# Patient Record
Sex: Female | Born: 1955 | Race: White | Hispanic: No | Marital: Married | Smoking: Former smoker
Health system: Southern US, Community
[De-identification: ages and names within clinical notes are randomized; demographics above are authoritative.]

## PROBLEM LIST (undated history)

## (undated) DIAGNOSIS — E119 Type 2 diabetes mellitus without complications: Secondary | ICD-10-CM

## (undated) DIAGNOSIS — R7303 Prediabetes: Secondary | ICD-10-CM

## (undated) DIAGNOSIS — I10 Essential (primary) hypertension: Secondary | ICD-10-CM

## (undated) DIAGNOSIS — C541 Malignant neoplasm of endometrium: Secondary | ICD-10-CM

## (undated) DIAGNOSIS — J45909 Unspecified asthma, uncomplicated: Secondary | ICD-10-CM

## (undated) DIAGNOSIS — G8929 Other chronic pain: Secondary | ICD-10-CM

---

## 2003-05-12 ENCOUNTER — Other Ambulatory Visit: Payer: Self-pay

## 2013-03-27 ENCOUNTER — Encounter (HOSPITAL_COMMUNITY): Admission: EM | Disposition: E | Payer: Self-pay | Source: Home / Self Care | Attending: Internal Medicine

## 2013-03-27 ENCOUNTER — Inpatient Hospital Stay (HOSPITAL_COMMUNITY): Payer: 59

## 2013-03-27 ENCOUNTER — Inpatient Hospital Stay (HOSPITAL_COMMUNITY)
Admission: EM | Admit: 2013-03-27 | Discharge: 2013-04-13 | DRG: 207 | Disposition: E | Payer: 59 | Attending: Internal Medicine | Admitting: Internal Medicine

## 2013-03-27 ENCOUNTER — Encounter (HOSPITAL_COMMUNITY): Payer: Self-pay | Admitting: Emergency Medicine

## 2013-03-27 DIAGNOSIS — C549 Malignant neoplasm of corpus uteri, unspecified: Secondary | ICD-10-CM | POA: Diagnosis present

## 2013-03-27 DIAGNOSIS — R000 Tachycardia, unspecified: Secondary | ICD-10-CM | POA: Diagnosis present

## 2013-03-27 DIAGNOSIS — C541 Malignant neoplasm of endometrium: Secondary | ICD-10-CM

## 2013-03-27 DIAGNOSIS — R319 Hematuria, unspecified: Secondary | ICD-10-CM | POA: Diagnosis present

## 2013-03-27 DIAGNOSIS — R079 Chest pain, unspecified: Secondary | ICD-10-CM

## 2013-03-27 DIAGNOSIS — J4489 Other specified chronic obstructive pulmonary disease: Secondary | ICD-10-CM | POA: Diagnosis present

## 2013-03-27 DIAGNOSIS — J9601 Acute respiratory failure with hypoxia: Secondary | ICD-10-CM | POA: Diagnosis present

## 2013-03-27 DIAGNOSIS — R102 Pelvic and perineal pain: Secondary | ICD-10-CM | POA: Diagnosis present

## 2013-03-27 DIAGNOSIS — E119 Type 2 diabetes mellitus without complications: Secondary | ICD-10-CM | POA: Diagnosis present

## 2013-03-27 DIAGNOSIS — I447 Left bundle-branch block, unspecified: Secondary | ICD-10-CM | POA: Diagnosis present

## 2013-03-27 DIAGNOSIS — J449 Chronic obstructive pulmonary disease, unspecified: Secondary | ICD-10-CM | POA: Diagnosis present

## 2013-03-27 DIAGNOSIS — Z8249 Family history of ischemic heart disease and other diseases of the circulatory system: Secondary | ICD-10-CM

## 2013-03-27 DIAGNOSIS — C7800 Secondary malignant neoplasm of unspecified lung: Principal | ICD-10-CM | POA: Diagnosis present

## 2013-03-27 DIAGNOSIS — R0902 Hypoxemia: Secondary | ICD-10-CM

## 2013-03-27 DIAGNOSIS — J9600 Acute respiratory failure, unspecified whether with hypoxia or hypercapnia: Secondary | ICD-10-CM | POA: Diagnosis present

## 2013-03-27 DIAGNOSIS — Z66 Do not resuscitate: Secondary | ICD-10-CM | POA: Diagnosis not present

## 2013-03-27 DIAGNOSIS — Z515 Encounter for palliative care: Secondary | ICD-10-CM

## 2013-03-27 DIAGNOSIS — E871 Hypo-osmolality and hyponatremia: Secondary | ICD-10-CM | POA: Diagnosis present

## 2013-03-27 DIAGNOSIS — I10 Essential (primary) hypertension: Secondary | ICD-10-CM | POA: Diagnosis present

## 2013-03-27 DIAGNOSIS — N939 Abnormal uterine and vaginal bleeding, unspecified: Secondary | ICD-10-CM | POA: Diagnosis present

## 2013-03-27 DIAGNOSIS — N898 Other specified noninflammatory disorders of vagina: Secondary | ICD-10-CM | POA: Diagnosis present

## 2013-03-27 DIAGNOSIS — N179 Acute kidney failure, unspecified: Secondary | ICD-10-CM | POA: Diagnosis present

## 2013-03-27 DIAGNOSIS — N950 Postmenopausal bleeding: Secondary | ICD-10-CM

## 2013-03-27 DIAGNOSIS — E669 Obesity, unspecified: Secondary | ICD-10-CM | POA: Diagnosis present

## 2013-03-27 DIAGNOSIS — R9431 Abnormal electrocardiogram [ECG] [EKG]: Secondary | ICD-10-CM

## 2013-03-27 DIAGNOSIS — D72829 Elevated white blood cell count, unspecified: Secondary | ICD-10-CM | POA: Diagnosis present

## 2013-03-27 DIAGNOSIS — R918 Other nonspecific abnormal finding of lung field: Secondary | ICD-10-CM | POA: Diagnosis present

## 2013-03-27 DIAGNOSIS — D45 Polycythemia vera: Secondary | ICD-10-CM | POA: Diagnosis present

## 2013-03-27 DIAGNOSIS — E8779 Other fluid overload: Secondary | ICD-10-CM | POA: Diagnosis present

## 2013-03-27 DIAGNOSIS — IMO0002 Reserved for concepts with insufficient information to code with codable children: Secondary | ICD-10-CM

## 2013-03-27 DIAGNOSIS — K5900 Constipation, unspecified: Secondary | ICD-10-CM | POA: Diagnosis present

## 2013-03-27 DIAGNOSIS — M549 Dorsalgia, unspecified: Secondary | ICD-10-CM | POA: Diagnosis present

## 2013-03-27 DIAGNOSIS — F17200 Nicotine dependence, unspecified, uncomplicated: Secondary | ICD-10-CM | POA: Diagnosis present

## 2013-03-27 DIAGNOSIS — Z6841 Body Mass Index (BMI) 40.0 and over, adult: Secondary | ICD-10-CM

## 2013-03-27 DIAGNOSIS — G8929 Other chronic pain: Secondary | ICD-10-CM | POA: Diagnosis present

## 2013-03-27 HISTORY — DX: Prediabetes: R73.03

## 2013-03-27 HISTORY — DX: Unspecified asthma, uncomplicated: J45.909

## 2013-03-27 HISTORY — DX: Essential (primary) hypertension: I10

## 2013-03-27 HISTORY — PX: LEFT HEART CATHETERIZATION WITH CORONARY ANGIOGRAM: SHX5451

## 2013-03-27 HISTORY — DX: Other chronic pain: G89.29

## 2013-03-27 HISTORY — DX: Type 2 diabetes mellitus without complications: E11.9

## 2013-03-27 HISTORY — DX: Malignant neoplasm of endometrium: C54.1

## 2013-03-27 LAB — ANGIOTENSIN CONVERTING ENZYME: Angiotensin-Converting Enzyme: 28 U/L (ref 8–52)

## 2013-03-27 LAB — COMPREHENSIVE METABOLIC PANEL
ALT: 13 U/L (ref 0–35)
AST: 30 U/L (ref 0–37)
Albumin: 3.1 g/dL — ABNORMAL LOW (ref 3.5–5.2)
Alkaline Phosphatase: 106 U/L (ref 39–117)
BUN: 20 mg/dL (ref 6–23)
CO2: 24 meq/L (ref 19–32)
CREATININE: 0.85 mg/dL (ref 0.50–1.10)
Calcium: 10.1 mg/dL (ref 8.4–10.5)
Chloride: 91 mEq/L — ABNORMAL LOW (ref 96–112)
GFR calc Af Amer: 86 mL/min — ABNORMAL LOW (ref >90)
GFR, EST NON AFRICAN AMERICAN: 74 mL/min — AB (ref >90)
Glucose, Bld: 105 mg/dL — ABNORMAL HIGH (ref 70–99)
Potassium: 4.8 mEq/L (ref 3.7–5.3)
SODIUM: 132 meq/L — AB (ref 137–147)
Total Bilirubin: 1 mg/dL (ref 0.3–1.2)
Total Protein: 7.3 g/dL (ref 6.0–8.3)

## 2013-03-27 LAB — CBC WITH DIFFERENTIAL/PLATELET
Basophils Absolute: 0 10*3/uL (ref 0.0–0.1)
Basophils Relative: 0 % (ref 0–1)
Eosinophils Absolute: 0 10*3/uL (ref 0.0–0.7)
Eosinophils Relative: 0 % (ref 0–5)
HCT: 50.3 % — ABNORMAL HIGH (ref 36.0–46.0)
HEMOGLOBIN: 17.7 g/dL — AB (ref 12.0–15.0)
LYMPHS ABS: 1.9 10*3/uL (ref 0.7–4.0)
LYMPHS PCT: 9 % — AB (ref 12–46)
MCH: 33.3 pg (ref 26.0–34.0)
MCHC: 35.2 g/dL (ref 30.0–36.0)
MCV: 94.5 fL (ref 78.0–100.0)
MONOS PCT: 12 % (ref 3–12)
Monocytes Absolute: 2.6 10*3/uL — ABNORMAL HIGH (ref 0.1–1.0)
Neutro Abs: 17 10*3/uL — ABNORMAL HIGH (ref 1.7–7.7)
Neutrophils Relative %: 79 % — ABNORMAL HIGH (ref 43–77)
PLATELETS: 299 10*3/uL (ref 150–400)
RBC: 5.32 MIL/uL — AB (ref 3.87–5.11)
RDW: 13 % (ref 11.5–15.5)
WBC: 21.5 10*3/uL — AB (ref 4.0–10.5)

## 2013-03-27 LAB — TROPONIN I
Troponin I: <0.3 ng/mL (ref <0.30)
Troponin I: <0.3 ng/mL (ref <0.30)
Troponin I: <0.3 ng/mL (ref <0.30)

## 2013-03-27 LAB — TSH: TSH: 1.229 u[IU]/mL (ref 0.350–4.500)

## 2013-03-27 LAB — URINALYSIS, ROUTINE W REFLEX MICROSCOPIC
Glucose, UA: NEGATIVE mg/dL
KETONES UR: NEGATIVE mg/dL
NITRITE: NEGATIVE
Protein, ur: 30 mg/dL — AB
UROBILINOGEN UA: 0.2 mg/dL (ref 0.0–1.0)
pH: 5 (ref 5.0–8.0)

## 2013-03-27 LAB — LIPID PANEL
CHOL/HDL RATIO: 8.2 ratio
Cholesterol: 196 mg/dL (ref 0–200)
HDL: 24 mg/dL — AB (ref >39)
LDL CALC: 149 mg/dL — AB (ref 0–99)
Triglycerides: 117 mg/dL (ref <150)
VLDL: 23 mg/dL (ref 0–40)

## 2013-03-27 LAB — SEDIMENTATION RATE
SED RATE: 30 mm/h — AB (ref 0–22)
SED RATE: 15 mm/h (ref 0–22)

## 2013-03-27 LAB — PROCALCITONIN: PROCALCITONIN: 0.38 ng/mL

## 2013-03-27 LAB — POCT I-STAT 3, ART BLOOD GAS (G3+)
ACID-BASE EXCESS: 3 mmol/L — AB (ref 0.0–2.0)
Bicarbonate: 30 mEq/L — ABNORMAL HIGH (ref 20.0–24.0)
O2 Saturation: 99 %
PH ART: 7.362 (ref 7.350–7.450)
PO2 ART: 122 mmHg — AB (ref 80.0–100.0)
Patient temperature: 98.6
TCO2: 32 mmol/L (ref 0–100)
pCO2 arterial: 52.8 mmHg — ABNORMAL HIGH (ref 35.0–45.0)

## 2013-03-27 LAB — HIV ANTIBODY (ROUTINE TESTING W REFLEX): HIV: NONREACTIVE

## 2013-03-27 LAB — APTT: aPTT: 29 seconds (ref 24–37)

## 2013-03-27 LAB — MRSA PCR SCREENING: MRSA BY PCR: NEGATIVE

## 2013-03-27 LAB — URINE MICROSCOPIC-ADD ON

## 2013-03-27 LAB — HEMOGLOBIN A1C
Hgb A1c MFr Bld: 6.2 % — ABNORMAL HIGH (ref <5.7)
Mean Plasma Glucose: 131 mg/dL — ABNORMAL HIGH (ref <117)

## 2013-03-27 LAB — MAGNESIUM: Magnesium: 2.2 mg/dL (ref 1.5–2.5)

## 2013-03-27 LAB — PROTIME-INR
INR: 1.23 (ref 0.00–1.49)
PROTHROMBIN TIME: 15.2 s (ref 11.6–15.2)

## 2013-03-27 LAB — STREP PNEUMONIAE URINARY ANTIGEN: Strep Pneumo Urinary Antigen: POSITIVE — AB

## 2013-03-27 SURGERY — LEFT HEART CATHETERIZATION WITH CORONARY ANGIOGRAM
Anesthesia: LOCAL

## 2013-03-27 MED ORDER — DEXTROSE 5 % IV SOLN
500.0000 mg | INTRAVENOUS | Status: DC
  Administered 2013-03-27 – 2013-03-31 (×5): 500 mg via INTRAVENOUS
  Filled 2013-03-27 (×8): qty 500

## 2013-03-27 MED ORDER — ATORVASTATIN CALCIUM 80 MG PO TABS
80.0000 mg | ORAL_TABLET | Freq: Every day | ORAL | Status: DC
  Administered 2013-03-27: 80 mg via ORAL
  Filled 2013-03-27 (×2): qty 1

## 2013-03-27 MED ORDER — NITROGLYCERIN 0.2 MG/ML ON CALL CATH LAB
INTRAVENOUS | Status: AC
  Filled 2013-03-27: qty 1

## 2013-03-27 MED ORDER — HEPARIN (PORCINE) IN NACL 2-0.9 UNIT/ML-% IJ SOLN
INTRAMUSCULAR | Status: AC
  Filled 2013-03-27: qty 1000

## 2013-03-27 MED ORDER — VERAPAMIL HCL 2.5 MG/ML IV SOLN
INTRAVENOUS | Status: AC
  Filled 2013-03-27: qty 2

## 2013-03-27 MED ORDER — SODIUM CHLORIDE 0.9 % IV SOLN: INTRAVENOUS | Status: AC

## 2013-03-27 MED ORDER — NITROGLYCERIN 0.4 MG SL SUBL: 0.4000 mg | SUBLINGUAL_TABLET | SUBLINGUAL | Status: DC | PRN

## 2013-03-27 MED ORDER — FENTANYL CITRATE 0.05 MG/ML IJ SOLN
25.0000 ug | INTRAMUSCULAR | Status: DC | PRN
  Administered 2013-03-27 (×2): 100 ug via INTRAVENOUS
  Administered 2013-03-28 (×4): 50 ug via INTRAVENOUS
  Administered 2013-03-28 – 2013-03-29 (×5): 100 ug via INTRAVENOUS
  Administered 2013-03-30: 50 ug via INTRAVENOUS
  Administered 2013-03-30 (×2): 100 ug via INTRAVENOUS
  Filled 2013-03-27 (×13): qty 2

## 2013-03-27 MED ORDER — HEPARIN SODIUM (PORCINE) 5000 UNIT/ML IJ SOLN: 4000.0000 [IU] | Freq: Once | INTRAMUSCULAR | Status: DC

## 2013-03-27 MED ORDER — ASPIRIN 81 MG PO CHEW
81.0000 mg | CHEWABLE_TABLET | Freq: Every day | ORAL | Status: DC
Start: 2013-03-27 — End: 2013-03-28
  Administered 2013-03-27: 81 mg via ORAL
  Filled 2013-03-27: qty 1

## 2013-03-27 MED ORDER — IOHEXOL 350 MG/ML SOLN
65.0000 mL | Freq: Once | INTRAVENOUS | Status: AC | PRN
  Administered 2013-03-27: 65 mL via INTRAVENOUS

## 2013-03-27 MED ORDER — PHENAZOPYRIDINE HCL 100 MG PO TABS
100.0000 mg | ORAL_TABLET | Freq: Three times a day (TID) | ORAL | Status: AC
  Administered 2013-03-27 – 2013-03-29 (×6): 100 mg via ORAL
  Filled 2013-03-27 (×7): qty 1

## 2013-03-27 MED ORDER — LIDOCAINE HCL (PF) 1 % IJ SOLN
INTRAMUSCULAR | Status: AC
  Filled 2013-03-27: qty 30

## 2013-03-27 MED ORDER — DEXTROSE 5 % IV SOLN
2.0000 g | INTRAVENOUS | Status: AC
  Administered 2013-03-27 – 2013-04-02 (×7): 2 g via INTRAVENOUS
  Filled 2013-03-27 (×8): qty 2

## 2013-03-27 MED ORDER — ASPIRIN EC 81 MG PO TBEC
81.0000 mg | DELAYED_RELEASE_TABLET | Freq: Every day | ORAL | Status: DC
  Administered 2013-03-28: 81 mg via ORAL
  Filled 2013-03-27: qty 1

## 2013-03-27 MED ORDER — SODIUM CHLORIDE 0.9 % IV SOLN
INTRAVENOUS | Status: DC
  Administered 2013-03-27 – 2013-03-30 (×2): via INTRAVENOUS
  Administered 2013-03-30: 100 mL via INTRAVENOUS
  Administered 2013-03-31 – 2013-04-03 (×3): via INTRAVENOUS

## 2013-03-27 MED ORDER — HEPARIN SODIUM (PORCINE) 1000 UNIT/ML IJ SOLN
INTRAMUSCULAR | Status: AC
  Filled 2013-03-27: qty 1

## 2013-03-27 MED ORDER — ENOXAPARIN SODIUM 40 MG/0.4ML ~~LOC~~ SOLN
40.0000 mg | SUBCUTANEOUS | Status: DC
  Administered 2013-03-27 – 2013-04-04 (×9): 40 mg via SUBCUTANEOUS
  Filled 2013-03-27 (×9): qty 0.4

## 2013-03-27 NOTE — Progress Notes (Signed)
Pt voided first time this shift in bedpan .Urine pink w/red"strings" w/in . Pt stated she had been post menopausal x 10 yrs  And that the urine is the same color at home --she's not sure for how long .

## 2013-03-27 NOTE — Plan of Care (Signed)
Problem: ICU Phase Progression Outcomes Goal: O2 sats trending toward baseline Outcome: Not Progressing Pt on 100% NRB mask , desats quickly to low 80's w/mild exertion Goal: Dyspnea controlled at rest Outcome: Progressing sats usu 95% on 100% NRB while resting(awake) in bed. Goal: Pain controlled with appropriate interventions Outcome: Not Progressing Constant lower abd to Lt hip to coccyx pain ( throbbing and dull)

## 2013-03-27 NOTE — H&P (Addendum)
History and Physical  Patient ID: Nicole Garrison MRN: [id number], DOB: 03/15/1955 Date of Encounter: 29-Mar-2013, 5:19 AM Primary Physician: No primary provider on file. Primary Cardiologist: None.  Chief Complaint: Chest pressure Reason for Admission: Chest pain with new LBBB  HPI: 58 y/o woman with history of chronic back pain and borderline DM, who developed acute chest pain at approximately 2200 yesterday evening.  She describes the pain as a severe pressure that awoke her from sleep.  She endorses accompanying dyspnea and nausea.  He pain was initially 10/10 in intensity but has subsequently decreased to 4/10 with fentanyl 100 mcg and NTG paste.  The patient denies a prior history of CAD.  She notes that both of her parents have CAD.  She recently quit smoking 1 week ago after a 20 year history of tobacco abuse.  She is compliant with her medications.  He has intermittent hematuria with "pink" urine that is being worked up.  Pt received ASA 324 mg x 1 by EMS and heparin 4,000 units IV x 1 in ED.   Past Medical History  Diagnosis Date  . Chronic pain     back      Most Recent Cardiac Studies: None.   Surgical History: No past surgical history on file.   Home Meds: Prior to Admission medications   Not on File    Allergies:  Allergies  Allergen Reactions  . Benadryl [Diphenhydramine Hcl (Sleep)]   . Codeine     History   Social History  . Marital Status: Married    Spouse Name: N/A    Number of Children: N/A  . Years of Education: N/A   Occupational History  . Not on file.   Social History Main Topics  . Smoking status: Former Research scientist (life sciences)  . Smokeless tobacco: Not on file  . Alcohol Use: No  . Drug Use: No  . Sexual Activity: Not on file   Other Topics Concern  . Not on file   Social History Narrative  . No narrative on file    FH: Mother and father both have a history of CAD.  Review of Systems: Chronic back pain.  Otherwise, a 10-system review of systems was  negative except as noted in the HPI.  Labs: None.  Radiology/Studies:  None.   EKG: Sinus tachycardia with LBBB.  No prior tracings available for comparison.  Physical Exam: Pulse 105, resp. rate 20, height 5\' 5"  (1.651 m), weight 114.76 kg (253 lb), SpO2 97.00%. General: Obese woman lying on stretcher.  She appears anxious. Head: Normocephalic, atraumatic, sclera non-icteric, no xanthomas, nares are without discharge.  Neck: No JVD or HJR, though evaluation is limited by body habitus. Lungs: Shallow inspiration; clear anteriorly. Heart: Tachycardic but regular without murmurs. Abdomen: Round, soft, non-tender, non-distended with normoactive bowel sounds. No hepatomegaly. No rebound/guarding. No obvious abdominal masses. Msk:  Strength and tone appear normal for age. Extremities: No clubbing or cyanosis. No edema.  Distal pedal pulses are 2+ and equal bilaterally. Neuro: Alert and oriented X 3. No focal deficit. No facial asymmetry. Moves all extremities spontaneously. Psych:  Responds to questions appropriately with a normal affect.    ASSESSMENT AND PLAN:  58 y/o F with history of chronic back pain, borderline DM, and tobacco abuse, who presented with severe chest pressure and dyspnea with EKG showing LBBB of uncertain chronicity.  Given the patient's symptoms and inability to evaluate the EKG for other ischemic changes, we will plan for urgent LHC.  - Proceed  to cath lab for urgent LHC.  - Plan for admission to cardiac ICU following procedure.  - TTE in AM.  - Risk stratify with lipid panel and hemoglobin A1c.  - Continue with ASA 81 mg daily; defer addition of P2Y12 agent until after LHC.  - CXR when pt arrives in cardiac ICU.  Signed, Jep Dyas A. MD 04/08/2013, 5:19 AM  Addendum (03/26/2013 @ 3235): LHC showed no signficicant CAD and normal LV function by LVgram.  Will admit to stepdown and obtain CTA chest to evaluate for PE, given continued hypoxia.

## 2013-03-27 NOTE — Progress Notes (Signed)
E-Link Dr made aware of CT scan results, Urine color ,current NPO status,etc..Marland KitchenMarland Kitchen

## 2013-03-27 NOTE — ED Notes (Signed)
Pt to ED from home. Cp that started last night around 10pm.  Has gotten worse throughout the night.  sts it hurts on her right side. Denies any medical problems.  Was given 100 Fentanyl, 324 ASA, 1inch nitro paste.

## 2013-03-27 NOTE — Progress Notes (Signed)
Asked by nurse to assess patient.  Patient desated on O2 [place] and now on 100% nonrebreather and intermittently drops to 89%.  Chest xray shows diffuse miliary pattern ? Viral PNA vs. Fungal process vs. Malignancy.  I have asked CCM to take over care since this appears to be a primary pneumonic process.  Cardiac cath showed normal coronary arteries and normal LVF.

## 2013-03-27 NOTE — CV Procedure (Signed)
Nicole Garrison is a 58 y.o. female    [id number] LOCATION:  FACILITY: [facility] [place]  PHYSICIAN: Quay Burow, M.D. Jul 27, 1955   DATE OF PROCEDURE:  03/24/2013  DATE OF DISCHARGE:     CARDIAC CATHETERIZATION     History obtained from chart review.58 y/o woman with history of chronic back pain and borderline DM, who developed acute chest pain at approximately 2200 yesterday evening. She describes the pain as a severe pressure that awoke her from sleep. She endorses accompanying dyspnea and nausea. He pain was initially 10/10 in intensity but has subsequently decreased to 4/10 with fentanyl 100 mcg and NTG paste. The patient denies a prior history of CAD. She notes that both of her parents have CAD. She recently quit smoking 1 week ago after a 20 year history of tobacco abuse. She is compliant with her medications. He has intermittent hematuria with "pink" urine that is being worked up. Pt received ASA 324 mg x 1 by EMS and heparin 4,000 units IV x 1 in ED.    PROCEDURE DESCRIPTION:   The patient was brought to the second floor [facility] cath lab in the postabsorptive state. She was not premedicated . Her right wristwas prepped and shaved in usual sterile fashion. Xylocaine 1% was used for local anesthesia. A 6 French sheath was inserted into the right radial artery using standard Seldinger technique. The patient received 5000 units  of heparin  intravenously.  6 [place] TIG catheters and pigtail catheters were used for selective coronary angiography and left ventriculography respectively.Visipaque dye was used for the entirety of the case. Retrograde aortic, left ventricular and pullback pressures were recorded.    HEMODYNAMICS:    AO SYSTOLIC/AO DIASTOLIC: 660/63   LV SYSTOLIC/LV DIASTOLIC: 016/01  ANGIOGRAPHIC RESULTS:   1. Left main; normal  2. LAD; normal 3. Left circumflex; nondominant and normal.  4. Right coronary artery; dominant and normal 5. Left ventriculography; RAO  left ventriculogram was performed using  25 mL of Visipaque dye at 12 mL/second. The overall LVEF estimated  60 %  Without wall motion abnormalities  IMPRESSION:[name] has normal coronaries and normal LV function. I believe her chest pain is noncardiac.I'm not sure about the chronicity of her left bundle branch block. She does have shortness of breath which may be related to COPD.I have recommended that we get a CT angiogram  to rule out a pulmonary embolus. The sheath was removed and a TR band was placed on the right wrist to achieve patent hemostasis.the patient left the lab in stable condition.  Lorretta Harp MD, [facility] 03/21/2013 6:06 AM

## 2013-03-27 NOTE — Consult Note (Signed)
PULMONARY / CRITICAL CARE MEDICINE   Name: Nicole Garrison MRN: [id number] DOB: 1955-11-11    ADMISSION DATE:  04/04/2013 CONSULTATION DATE:  3/15  REFERRING MD :  Radford Pax PRIMARY SERVICE: Cards->PCCM  CHIEF COMPLAINT: Chest pain  BRIEF PATIENT DESCRIPTION:  58 yo obese (257 lbs) female , 1 ppd smoker age 22 till 1 week ago, who has had abdominal/back pain x 1 month and has been on narcotics x 7 days with recent constipation. She awoke 3/13 2200 with acute mid sternal chest pain thought to be cardiac in origin.  SIGNIFICANT EVENTS / STUDIES:  3/15 negative cardiac cath  LINES / TUBES:   CULTURES: 3/15 bc x 2>> 3/15 UC>> 3/15 UA - WBC 11-20, TOO MANY RRBC, GRANULAR CAST +, KETONES NEGATIVE, LEUK - SMALL 3/15 - Urine STREP POSITIVE 3/15 toxicology screen>> 3/15 viral panel>>  3/15 HIV   AUTOIMMUNE ANA ESSR SED Rate ANCA DNA CCP  RF Ssa, ssb scl 70   ANTIBIOTICS:   HISTORY OF PRESENT ILLNESS:   58 yo obese (257 lbs) female , 1 ppd smoker age 37 till 1 week ago, who has had abdominal/back pain/hematuria  x 1-3 month and has been on narcotics with bed rest due to  x 7 days with recent constipation. She awoke 03/25/13 2200 with acute mid sternal chest pain thought to be cardiac in origin. Left heart cath 3/15 was negative and chest x ray revealed miliary pattern, she developed increasing hypoxia refractory to O2. Of note she was started on narcotics for chronic abd.back pain 1 week ago. She notes waking up choking with gel like substance in her throat. Negative for foreign exposures, no fowl exposure, mold but does have live pit bulls who sleep on bed. No reports of F/C/S or sputum production. Due to negative cardiac cath and extensive radiographic radiographic changes on c x r  And hypoxemia  CT angio chest was done - PE negative but showed diffuse bilateral nodular densities suggestive of malignancy. PCCM asked to assume her care. Currently on 60% NRB and not in acute distress.     PAST MEDICAL HISTORY :  Past Medical History  Diagnosis Date  . Chronic pain     back   . Borderline diabetes    No past surgical history on file. Prior to Admission medications   Medication Sig Start Date End Date Taking? Authorizing Provider  albuterol (PROVENTIL HFA;VENTOLIN HFA) 108 (90 BASE) MCG/ACT inhaler Inhale 2 puffs into the lungs every 6 (six) hours as needed for wheezing or shortness of breath.   Yes Historical Provider, MD  naproxen sodium (ANAPROX) 220 MG tablet Take 220 mg by mouth 2 (two) times daily as needed (Pain).   Yes Historical Provider, MD  promethazine (PHENERGAN) 25 MG tablet Take 25 mg by mouth every 4 (four) hours as needed for nausea or vomiting.   Yes Historical Provider, MD  traMADol (ULTRAM) 50 MG tablet Take 50 mg by mouth every 4 (four) hours as needed (pain).    Yes Historical Provider, MD   Allergies  Allergen Reactions  . Benadryl [Diphenhydramine Hcl (Sleep)] Hypertension  . Codeine Hives and Other (See Comments)    Headaches    FAMILY HISTORY:  No family history on file. SOCIAL HISTORY:  reports that she has quit smoking. She does not have any smokeless tobacco history on file. She reports that she does not drink alcohol or use illicit drugs.  REVIEW OF SYSTEMS:  10 point review of system taken, please see HPI for  positives and negatives.   SUBJECTIVE:   VITAL SIGNS: Temp:  [97.4 F (36.3 C)-98.9 F (37.2 C)] 98.9 F (37.2 C) (03/15 0800) Pulse Rate:  [81-105] 85 (03/15 0930) Resp:  [20-30] 27 (03/15 0930) BP: (101-130)/(56-74) 122/74 mmHg (03/15 0930) SpO2:  [89 %-97 %] 96 % (03/15 0930) FiO2 (%):  [80 %-100 %] 100 % (03/15 0930) Weight:  [114.76 kg (253 lb)-116.9 kg (257 lb 11.5 oz)] 116.9 kg (257 lb 11.5 oz) (03/15 0625) HEMODYNAMICS:   VENTILATOR SETTINGS: Vent Mode:  [-]  FiO2 (%):  [80 %-100 %] 100 % INTAKE / OUTPUT: Intake/Output     03/14 0701 - 03/15 0700 03/15 0701 - 03/16 0700   I.V. (mL/kg) 75 (0.6) 75 (0.6)    Total Intake(mL/kg) 75 (0.6) 75 (0.6)   Net +75 +75          PHYSICAL EXAMINATION: General: Obese wm nad at rest Neuro:  Intact HEENT: No JVD/LAN. Oral pharynx unremarable  Cardiovascular:  HSR RRR Lungs:  Decreased bs bases. No accessory muscle use. Chest tender to palpation mid sternal area and pain increased with deep breath Abdomen:  + bs, diffuse tenderness Musculoskeletal:  intact Skin:  warm  LABS:  PULMONARY  Recent Labs Lab 2013-04-14 1019  PHART 7.362  PCO2ART 52.8*  PO2ART 122.0*  HCO3 30.0*  TCO2 32  O2SAT 99.0    CBC  Recent Labs Lab 04/14/13 0740  HGB 17.7*  HCT 50.3*  WBC 21.5*  PLT 299    COAGULATION  Recent Labs Lab 04-14-13 0740  INR 1.23    CARDIAC   Recent Labs Lab 04/14/13 0740 14-Apr-2013 1305  TROPONINI <0.30 <0.30   No results found for this basename: PROBNP,  in the last 168 hours   CHEMISTRY  Recent Labs Lab 04/14/13 0740  NA 132*  K 4.8  CL 91*  CO2 24  GLUCOSE 105*  BUN 20  CREATININE 0.85  CALCIUM 10.1  MG 2.2   Estimated Creatinine Clearance: 90.7 ml/min (by C-G formula based on Cr of 0.85).   LIVER  Recent Labs Lab 04/14/13 0740  AST 30  ALT 13  ALKPHOS 106  BILITOT 1.0  PROT 7.3  ALBUMIN 3.1*  INR 1.23     INFECTIOUS  Recent Labs Lab 04-14-2013 1305  PROCALCITON 0.38     ENDOCRINE CBG (last 3)  No results found for this basename: GLUCAP,  in the last 72 hours       IMAGING x48h  Dg Abd 1 View  04-14-2013   CLINICAL DATA:  Pain and abdominal distention  EXAM: ABDOMEN - 1 VIEW  COMPARISON:  CT ANGIO CHEST W/CM &/OR WO/CM dated 2013/04/14  FINDINGS: Contrast is identified within nondilated renal collecting systems. The abdomen is incompletely included in the field of view, partly due to body habitus and positioning. No evidence for gaseous bowel obstruction. Presence or absence of air-fluid levels or free air is suboptimally evaluated on this supine projection. Bilateral lower  lobe pulmonary nodules are partly visualized, not as well as on the dedicated exam performed earlier today. No acute osseous finding.  IMPRESSION: No plain film evidence for bowel obstruction. Please see chest CT report dictated separately today.   Electronically Signed   By: Conchita Paris M.D.   On: 2013/04/14 13:08   Ct Angio Chest Pe W/cm &/or Wo Cm  04/14/13   CLINICAL DATA:  Right-sided chest pain, shortness of breath  EXAM: CT ANGIOGRAPHY CHEST WITH CONTRAST  TECHNIQUE: Multidetector CT imaging of  the chest was performed using the standard protocol during bolus administration of intravenous contrast. Multiplanar CT image reconstructions and MIPs were obtained to evaluate the vascular anatomy.  CONTRAST:  74mL OMNIPAQUE IOHEXOL 350 MG/ML SOLN  COMPARISON:  DG CHEST 1V PORT dated 03/14/2013  FINDINGS: There are innumerable pulmonary nodules throughout both lungs, representative dominant right middle lobe pulmonary nodule measuring 1.4 cm image 45. Trace pleural fluid or thickening noted. There is extensive dependent compressive atelectasis.  The thyroid is normal in appearance where visualized. Great vessels are normal in caliber. Mild atheromatous aortic calcification without aneurysm. Allowing for mild bolus inhomogeneity due to timing, there is no filling defect up to and involving the third order pulmonary arteries to suggest acute pulmonary embolism. Subsegmental emboli may be obscured by bolus inhomogeneity particularly at the lung bases. Heart size is at upper limits of normal. Bilateral hilar lymphadenopathy is noted, 1 cm on the left, 1.4 cm on the right. Sub carinal confluent lymphadenopathy measures 2.0 cm image 41. Pretracheal nodal conglomerate measures 1.2 cm image 30. No axillary lymphadenopathy. Central airways are patent.  No pericardial effusion. Incomplete imaging of the upper abdomen demonstrates incomplete visualization of the adrenal glands but no mass identified. Multilevel disc  degenerative change noted in the spine. Schmorl's node formation is noted at multiple levels. No lytic or sclerotic osseous lesion is identified.  Review of the MIP images confirms the above findings.  IMPRESSION: Innumerable diffuse pulmonary nodules with mediastinal and hilar lymphadenopathy. Differential consideration includes hematogenous spread of metastatic disease from possible abdominal or pelvic primary neoplasm, miliary tuberculosis, other infection such as fungal infection, and sarcoid. If further evaluation for possible underlying malignancy is indicated, CT abdomen/pelvis with contrast would be recommended for further evaluation.  Allowing for mild inhomogeneity of the contrast bolus predominantly at the lung bases, there is no CT evidence for acute pulmonary embolism up to and including the third order pulmonary arteries.   Electronically Signed   By: Conchita Paris M.D.   On: 04/03/2013 13:03   Portable Chest X-ray 1 View  04/02/2013   CLINICAL DATA:  Short of breath  EXAM: PORTABLE CHEST - 1 VIEW  COMPARISON:  None.  FINDINGS: Highly abnormal appearance of the lungs. Diffuse micronodular pattern of opacities throughout both lungs suggestive of a miliary pattern. There may be trace bilateral pleural effusions. No pneumothorax. Cardiac and mediastinal contours are within normal limits given portable frontal technique.  IMPRESSION: Highly abnormal appearance of the lungs with diffuse innumerable nodular opacities throughout both lungs suggestive of a miliary pattern. Differential considerations include atypical infections including viral pneumonia, tuberculosis and fungal infection, widespread hematogenous metastases, sarcoidosis, and other less likely considerations such as hypersensitivity pneumonitis.  Consider further evaluation with CT scan of the chest.   Electronically Signed   By: Jacqulynn Cadet M.D.   On: 03/23/2013 09:05      ASSESSMENT / PLAN:  PULMONARY A:Hypoxia with acute  resp failure - Diffuse nodular infiltrates. Urine strep positive. V V V low risk for Miliary TB. PE negative     P:   O2 as tolerated, if worse bipap/intubate Symptom relief with morphine for dyspnea  CARDIOVASCULAR A: HTN     Chest pain->neg cc P:  Monitor bp Morphine prn for pain  RENAL A:   Hematuria x 1-3 months At risk for contrast induce nephropathy. P:   Check bmet Hydrate with saline 100cc/h Consider waiting 24 -48h before any dye load again Might need cystoscopy / uro-gyn consult Await urine  culture  GASTROINTESTINAL A: Abd pain x 1 - 3 month with hematuria. COncern for malignancy; patient/family unaware     Constipation P:   CT abd/pelvis when risk ofor contrast reduces  HEMATOLOGIC A:  Relative polycythemia P:  Monitor with hydration  INFECTIOUS A:  No overt infectious process but urine strep positive  P:   Rx for CAP with ceftriaxone and azithro Pan culture Viral panels procal  ENDOCRINE A:  Diet control DM P:   SSI  NEUROLOGIC A:  No acute issue other than chronic pain of suprapubic pain with radiating to left x 3 months with chest pain x 1 week. severe P:   Pyridium for possible dysuria x 2 days Fentanyl prn for pain      Richardson Landry Minor ACNP Maryanna Shape PCCM Pager [phone] till 3 pm If no answer page [phone] 04/02/2013, 10:42 AM   STAFF NOTE: I, Dr Ann Lions have personally reviewed patient's available data, including medical history, events of note, physical examination and test results as part of my evaluation. I have discussed with resident/NP and other care providers such as pharmacist, RN and RRT.  In addition,  I personally evaluated patient and elicited key findings of canon bal lesions of lung with acute hypoxemic resp failure in setting of chronic hematuria and supra pubic pain. Suspect malignancy with mets. Will need to wait 24-48h to ensure no Contrast induced nephropathy before doing CT abdomen pelvis. Has unexpected finding of  urine strep antigen positive (high specific test) so will Rx for ICU CAP. Will hydrate. Allow clears. Given broad ddx to patient and family but possibility of cancer not mentioned at this stage (young kids present in room).  Rest per NP/medical resident whose note is outlined above and that I agree with  The patient is critically ill with multiple organ systems failure and requires high complexity decision making for assessment and support, frequent evaluation and titration of therapies, application of advanced monitoring technologies and extensive interpretation of multiple databases.   Critical Care Time devoted to patient care services described in this note is  45  Minutes.  Dr. Brand Males, M.D., [facility]C.P Pulmonary and Critical Care Medicine Staff Physician [facility] Pager: [phone], If no answer or between  15:00h - 7:00h: call [phone]  03/31/2013 4:20 PM

## 2013-03-27 NOTE — Progress Notes (Signed)
C/o stabbing pain,"like electrical shock" to mid sternum ( and slightly to the Lt of mid-sternal area) the patient denies back pain . This pain "comes and goes but pt does have increase in mid-sternal pain w/deep resps . Pt does c/o dull pain to supra-pubic area that radiates to Lt hip( between ileac crest and trochanter) and continues radiating towards coccyx area. Pt states she had been entirely "bedridden for a week" because this on-going lower region(torso) pain "had gotten so bad."

## 2013-03-27 NOTE — ED Notes (Signed)
Pt given 4000 units heparin ivp

## 2013-03-27 NOTE — ED Notes (Signed)
Cardiologist at bedside to see pt

## 2013-03-28 ENCOUNTER — Inpatient Hospital Stay (HOSPITAL_COMMUNITY): Payer: 59

## 2013-03-28 ENCOUNTER — Encounter (HOSPITAL_COMMUNITY): Payer: Self-pay | Admitting: Radiology

## 2013-03-28 DIAGNOSIS — R079 Chest pain, unspecified: Secondary | ICD-10-CM

## 2013-03-28 DIAGNOSIS — N898 Other specified noninflammatory disorders of vagina: Secondary | ICD-10-CM

## 2013-03-28 DIAGNOSIS — N939 Abnormal uterine and vaginal bleeding, unspecified: Secondary | ICD-10-CM | POA: Diagnosis present

## 2013-03-28 DIAGNOSIS — R918 Other nonspecific abnormal finding of lung field: Secondary | ICD-10-CM | POA: Diagnosis present

## 2013-03-28 DIAGNOSIS — K5900 Constipation, unspecified: Secondary | ICD-10-CM | POA: Diagnosis present

## 2013-03-28 LAB — MAGNESIUM: Magnesium: 2.1 mg/dL (ref 1.5–2.5)

## 2013-03-28 LAB — CBC
HEMATOCRIT: 47.4 % — AB (ref 36.0–46.0)
Hemoglobin: 16.4 g/dL — ABNORMAL HIGH (ref 12.0–15.0)
MCH: 33.1 pg (ref 26.0–34.0)
MCHC: 34.6 g/dL (ref 30.0–36.0)
MCV: 95.8 fL (ref 78.0–100.0)
Platelets: 274 10*3/uL (ref 150–400)
RBC: 4.95 MIL/uL (ref 3.87–5.11)
RDW: 13.1 % (ref 11.5–15.5)
WBC: 22.5 10*3/uL — ABNORMAL HIGH (ref 4.0–10.5)

## 2013-03-28 LAB — LEGIONELLA ANTIGEN, URINE: Legionella Antigen, Urine: NEGATIVE

## 2013-03-28 LAB — MPO/PR-3 (ANCA) ANTIBODIES
Myeloperoxidase Abs: <1
Serine Protease 3: <1

## 2013-03-28 LAB — POCT I-STAT, CHEM 8
BUN: 18 mg/dL (ref 6–23)
Calcium, Ion: 1.3 mmol/L — ABNORMAL HIGH (ref 1.12–1.23)
Chloride: 93 mEq/L — ABNORMAL LOW (ref 96–112)
Creatinine, Ser: 1 mg/dL (ref 0.50–1.10)
Glucose, Bld: 142 mg/dL — ABNORMAL HIGH (ref 70–99)
HEMATOCRIT: 55 % — AB (ref 36.0–46.0)
Hemoglobin: 18.7 g/dL — ABNORMAL HIGH (ref 12.0–15.0)
POTASSIUM: 4.3 meq/L (ref 3.7–5.3)
Sodium: 131 mEq/L — ABNORMAL LOW (ref 137–147)
TCO2: 25 mmol/L (ref 0–100)

## 2013-03-28 LAB — CYCLIC CITRUL PEPTIDE ANTIBODY, IGG: Cyclic Citrullin Peptide Ab: <2 U/mL (ref 0.0–5.0)

## 2013-03-28 LAB — BASIC METABOLIC PANEL
BUN: 19 mg/dL (ref 6–23)
CHLORIDE: 95 meq/L — AB (ref 96–112)
CO2: 27 meq/L (ref 19–32)
CREATININE: 0.58 mg/dL (ref 0.50–1.10)
Calcium: 9.5 mg/dL (ref 8.4–10.5)
GFR calc Af Amer: >90 mL/min (ref >90)
GFR calc non Af Amer: >90 mL/min (ref >90)
Glucose, Bld: 121 mg/dL — ABNORMAL HIGH (ref 70–99)
Potassium: 4.5 mEq/L (ref 3.7–5.3)
Sodium: 134 mEq/L — ABNORMAL LOW (ref 137–147)

## 2013-03-28 LAB — ANA: ANA: NEGATIVE

## 2013-03-28 LAB — PROCALCITONIN: PROCALCITONIN: 0.55 ng/mL

## 2013-03-28 LAB — ANTI-DNA ANTIBODY, DOUBLE-STRANDED: ds DNA Ab: <1 IU/mL

## 2013-03-28 LAB — ANTI-SCLERODERMA ANTIBODY: Scleroderma (Scl-70) (ENA) Antibody, IgG: <1

## 2013-03-28 LAB — ANCA SCREEN W REFLEX TITER
Atypical p-ANCA Screen: NEGATIVE
c-ANCA Screen: NEGATIVE
p-ANCA Screen: NEGATIVE

## 2013-03-28 LAB — PHOSPHORUS: Phosphorus: 1.9 mg/dL — ABNORMAL LOW (ref 2.3–4.6)

## 2013-03-28 MED ORDER — IOHEXOL 300 MG/ML  SOLN
25.0000 mL | INTRAMUSCULAR | Status: AC
  Administered 2013-03-28 (×2): 25 mL via ORAL

## 2013-03-28 MED ORDER — HYDRALAZINE HCL 20 MG/ML IJ SOLN
INTRAMUSCULAR | Status: AC
  Filled 2013-03-28: qty 1

## 2013-03-28 MED ORDER — IOHEXOL 300 MG/ML  SOLN
100.0000 mL | Freq: Once | INTRAMUSCULAR | Status: AC | PRN
  Administered 2013-03-28: 100 mL via INTRAVENOUS

## 2013-03-28 NOTE — Progress Notes (Signed)
We would like to sign off from cardiac standpoint and hope that Pulmonary/Critidcal care will manage going forward.

## 2013-03-28 NOTE — Progress Notes (Signed)
PULMONARY / CRITICAL CARE MEDICINE   Name: Nicole Garrison MRN: [id number] DOB: 1955/02/05    ADMISSION DATE:  04/09/2013 CONSULTATION DATE:  04/12/2013  REFERRING MD :  Radford Pax  CHIEF COMPLAINT: Chest pain  BRIEF PATIENT DESCRIPTION:  58 yo female smoker presented with abdominal pain x 1 month, constipation and chest pain.  Found to have hypoxia and multiple pulmonary nodules.  PCCM asked to assume care.  SIGNIFICANT EVENTS: 3/15 Admit, cardiac cath  STUDIES:  3/15 Cardiac cath >> normal coronaries and LV fx 3/15 CT chest >> innumerable diffuse pulmonary nodules with mediastinal/hilar LAN 3/15 Labs >> ESR 30, ACE 28  LINES / TUBES: PIV  CULTURES: Pneumococcal Ag 3/15 >> positive Legionella Ag 3/15 >> negative  ANTIBIOTICS: Rocephin 3/15 >> Zithromax 3/15 >>  SUBJECTIVE:  She denies fever, hemoptysis, or sweats.  Reports constipation for several weeks, bloating.  Has vaginal bleeding for several months >> was to see GYN this week.  Breathing okay.  Denies chest pain.  VITAL SIGNS: Temp:  [97.4 F (36.3 C)-99.2 F (37.3 C)] 97.4 F (36.3 C) (03/16 0810) Pulse Rate:  [47-99] 86 (03/16 0810) Resp:  [17-30] 26 (03/16 0810) BP: (103-163)/(23-97) 145/68 mmHg (03/16 0810) SpO2:  [78 %-100 %] 85 % (03/16 0810) FiO2 (%):  [100 %] 100 % (03/15 2200) Weight:  [257 lb 0.9 oz (116.6 kg)] 257 lb 0.9 oz (116.6 kg) (03/16 0451) INTAKE / OUTPUT: Intake/Output     03/15 0701 - 03/16 0700 03/16 0701 - 03/17 0700   P.O. 240 360   I.V. (mL/kg) 1475 (12.7) 200 (1.7)   IV Piggyback 300    Total Intake(mL/kg) 2015 (17.3) 560 (4.8)   Urine (mL/kg/hr) 875 (0.3)    Total Output 875     Net +1140 +560          PHYSICAL EXAMINATION: General: no distress Neuro: normal strength HEENT: no sinus tenderness Cardiovascular: regular Lungs: no wheeze Abdomen: soft, non tender, mild distention, + bowel sounds Musculoskeletal: no edema Skin: no rashes  LABS: CBC Recent Labs      03/30/2013  0740  03/28/13  0720  WBC  21.5*  22.5*  HGB  17.7*  16.4*  HCT  50.3*  47.4*  PLT  299  274    Coag's Recent Labs     04/09/2013  0740  APTT  29  INR  1.23    BMET Recent Labs     03/19/2013  0740  03/28/13  0720  NA  132*  134*  K  4.8  4.5  CL  91*  95*  CO2  24  27  BUN  20  19  CREATININE  0.85  0.58  GLUCOSE  105*  121*    Electrolytes Recent Labs     04/11/2013  0740  03/28/13  0720  CALCIUM  10.1  9.5  MG  2.2  2.1  PHOS   --   1.9*    Sepsis Markers Recent Labs     03/16/2013  1305  03/28/13  0720  PROCALCITON  0.38  0.55    ABG Recent Labs     03/13/2013  1019  PHART  7.362  PCO2ART  52.8*  PO2ART  122.0*    Liver Enzymes Recent Labs     04/08/2013  0740  AST  30  ALT  13  ALKPHOS  106  BILITOT  1.0  ALBUMIN  3.1*    Cardiac Enzymes Recent Labs     03/15/2013  0740  03/21/2013  1305  03/20/2013  1933  TROPONINI  <0.30  <0.30  <0.30   Imaging Dg Abd 1 View  03/14/2013   CLINICAL DATA:  Pain and abdominal distention  EXAM: ABDOMEN - 1 VIEW  COMPARISON:  CT ANGIO CHEST W/CM &/OR WO/CM dated 04/08/2013  FINDINGS: Contrast is identified within nondilated renal collecting systems. The abdomen is incompletely included in the field of view, partly due to body habitus and positioning. No evidence for gaseous bowel obstruction. Presence or absence of air-fluid levels or free air is suboptimally evaluated on this supine projection. Bilateral lower lobe pulmonary nodules are partly visualized, not as well as on the dedicated exam performed earlier today. No acute osseous finding.  IMPRESSION: No plain film evidence for bowel obstruction. Please see chest CT report dictated separately today.   Electronically Signed   By: Conchita Paris M.D.   On: 04/04/2013 13:08   Ct Angio Chest Pe W/cm &/or Wo Cm  04/11/2013   CLINICAL DATA:  Right-sided chest pain, shortness of breath  EXAM: CT ANGIOGRAPHY CHEST WITH CONTRAST  TECHNIQUE: Multidetector CT  imaging of the chest was performed using the standard protocol during bolus administration of intravenous contrast. Multiplanar CT image reconstructions and MIPs were obtained to evaluate the vascular anatomy.  CONTRAST:  68mL OMNIPAQUE IOHEXOL 350 MG/ML SOLN  COMPARISON:  DG CHEST 1V PORT dated 03/25/2013  FINDINGS: There are innumerable pulmonary nodules throughout both lungs, representative dominant right middle lobe pulmonary nodule measuring 1.4 cm image 45. Trace pleural fluid or thickening noted. There is extensive dependent compressive atelectasis.  The thyroid is normal in appearance where visualized. Great vessels are normal in caliber. Mild atheromatous aortic calcification without aneurysm. Allowing for mild bolus inhomogeneity due to timing, there is no filling defect up to and involving the third order pulmonary arteries to suggest acute pulmonary embolism. Subsegmental emboli may be obscured by bolus inhomogeneity particularly at the lung bases. Heart size is at upper limits of normal. Bilateral hilar lymphadenopathy is noted, 1 cm on the left, 1.4 cm on the right. Sub carinal confluent lymphadenopathy measures 2.0 cm image 41. Pretracheal nodal conglomerate measures 1.2 cm image 30. No axillary lymphadenopathy. Central airways are patent.  No pericardial effusion. Incomplete imaging of the upper abdomen demonstrates incomplete visualization of the adrenal glands but no mass identified. Multilevel disc degenerative change noted in the spine. Schmorl's node formation is noted at multiple levels. No lytic or sclerotic osseous lesion is identified.  Review of the MIP images confirms the above findings.  IMPRESSION: Innumerable diffuse pulmonary nodules with mediastinal and hilar lymphadenopathy. Differential consideration includes hematogenous spread of metastatic disease from possible abdominal or pelvic primary neoplasm, miliary tuberculosis, other infection such as fungal infection, and sarcoid. If  further evaluation for possible underlying malignancy is indicated, CT abdomen/pelvis with contrast would be recommended for further evaluation.  Allowing for mild inhomogeneity of the contrast bolus predominantly at the lung bases, there is no CT evidence for acute pulmonary embolism up to and including the third order pulmonary arteries.   Electronically Signed   By: Conchita Paris M.D.   On: 03/25/2013 13:03   Dg Chest Port 1 View  03/28/2013   CLINICAL DATA:  Respiratory distress  EXAM: PORTABLE CHEST - 1 VIEW  COMPARISON:  03/22/2013  FINDINGS: Cardiac shadow is stable. Miliary pattern is noted throughout both lungs and stable. No new focal abnormality is seen.  IMPRESSION: No change from the previous exam.   Electronically Signed  By: Inez Catalina M.D.   On: 03/28/2013 07:24   Portable Chest X-ray 1 View  03/30/2013   CLINICAL DATA:  Short of breath  EXAM: PORTABLE CHEST - 1 VIEW  COMPARISON:  None.  FINDINGS: Highly abnormal appearance of the lungs. Diffuse micronodular pattern of opacities throughout both lungs suggestive of a miliary pattern. There may be trace bilateral pleural effusions. No pneumothorax. Cardiac and mediastinal contours are within normal limits given portable frontal technique.  IMPRESSION: Highly abnormal appearance of the lungs with diffuse innumerable nodular opacities throughout both lungs suggestive of a miliary pattern. Differential considerations include atypical infections including viral pneumonia, tuberculosis and fungal infection, widespread hematogenous metastases, sarcoidosis, and other less likely considerations such as hypersensitivity pneumonitis.  Consider further evaluation with CT scan of the chest.   Electronically Signed   By: Jacqulynn Cadet M.D.   On: 03/25/2013 09:05    ASSESSMENT / PLAN:  A: Acute hypoxic respiratory failure, chest pain with multiple pulmonary nodules, vaginal bleeding, abnormal bowel function >> likely represents malignancy from  intra-abdominal source. P: Oxygen to keep SpO2 > 92% F/u CXR intermittently CT abd/pelvis 3/16 F/u CTD labs from 3/15 Will need tissue sampling >> best approach to be determined after review of CT abd/pelvis Cardiac cath negative >> d/c ASA, lipitor  A: Urine pneumococcal Ag positive. P: Day 2 of rocephin, zithromax  A: Hx of HTN. P: Monitor blood pressure   A: Polycythemia. P: F/u CBC  A: Diet controlled DM. P: Monitor blood sugar on BMET  A: Dysuria. P: Continue pyridium  Updated husband at bedside about plan.  Chesley Mires, MD [facility] Pulmonary/Critical Care 03/28/2013, 11:05 AM Pager:  [phone] After 3pm call: [phone]

## 2013-03-29 DIAGNOSIS — R0902 Hypoxemia: Secondary | ICD-10-CM | POA: Diagnosis present

## 2013-03-29 DIAGNOSIS — N950 Postmenopausal bleeding: Secondary | ICD-10-CM | POA: Diagnosis present

## 2013-03-29 LAB — CBC
HEMATOCRIT: 50.6 % — AB (ref 36.0–46.0)
HEMOGLOBIN: 17.4 g/dL — AB (ref 12.0–15.0)
MCH: 33.4 pg (ref 26.0–34.0)
MCHC: 34.4 g/dL (ref 30.0–36.0)
MCV: 97.1 fL (ref 78.0–100.0)
Platelets: 279 10*3/uL (ref 150–400)
RBC: 5.21 MIL/uL — ABNORMAL HIGH (ref 3.87–5.11)
RDW: 13.2 % (ref 11.5–15.5)
WBC: 28.6 10*3/uL — AB (ref 4.0–10.5)

## 2013-03-29 LAB — COMPREHENSIVE METABOLIC PANEL
ALBUMIN: 2.8 g/dL — AB (ref 3.5–5.2)
ALK PHOS: 135 U/L — AB (ref 39–117)
ALT: 13 U/L (ref 0–35)
AST: 30 U/L (ref 0–37)
BUN: 18 mg/dL (ref 6–23)
CO2: 25 mEq/L (ref 19–32)
Calcium: 10.1 mg/dL (ref 8.4–10.5)
Chloride: 93 mEq/L — ABNORMAL LOW (ref 96–112)
Creatinine, Ser: 0.63 mg/dL (ref 0.50–1.10)
GFR calc Af Amer: >90 mL/min (ref >90)
GFR calc non Af Amer: >90 mL/min (ref >90)
Glucose, Bld: 108 mg/dL — ABNORMAL HIGH (ref 70–99)
POTASSIUM: 4.8 meq/L (ref 3.7–5.3)
SODIUM: 134 meq/L — AB (ref 137–147)
Total Bilirubin: 1 mg/dL (ref 0.3–1.2)
Total Protein: 7.3 g/dL (ref 6.0–8.3)

## 2013-03-29 LAB — RESPIRATORY VIRUS PANEL
ADENOVIRUS: NOT DETECTED
INFLUENZA A H1: NOT DETECTED
INFLUENZA A: NOT DETECTED
Influenza A H3: NOT DETECTED
Influenza B: NOT DETECTED
Metapneumovirus: NOT DETECTED
PARAINFLUENZA 3 A: NOT DETECTED
Parainfluenza 1: NOT DETECTED
Parainfluenza 2: NOT DETECTED
Respiratory Syncytial Virus A: NOT DETECTED
Respiratory Syncytial Virus B: NOT DETECTED
Rhinovirus: NOT DETECTED

## 2013-03-29 LAB — GLUCOSE, CAPILLARY: GLUCOSE-CAPILLARY: 92 mg/dL (ref 70–99)

## 2013-03-29 LAB — SJOGRENS SYNDROME-A EXTRACTABLE NUCLEAR ANTIBODY: SSA (Ro) (ENA) Antibody, IgG: <1

## 2013-03-29 LAB — SJOGRENS SYNDROME-B EXTRACTABLE NUCLEAR ANTIBODY: SSB (LA) (ENA) ANTIBODY, IGG: NEGATIVE

## 2013-03-29 MED ORDER — PREDNISONE 20 MG PO TABS
30.0000 mg | ORAL_TABLET | Freq: Every day | ORAL | Status: DC
  Administered 2013-03-29 – 2013-03-30 (×2): 30 mg via ORAL
  Filled 2013-03-29 (×4): qty 1

## 2013-03-29 MED ORDER — IPRATROPIUM-ALBUTEROL 0.5-2.5 (3) MG/3ML IN SOLN
3.0000 mL | Freq: Four times a day (QID) | RESPIRATORY_TRACT | Status: DC
  Administered 2013-03-29 – 2013-03-31 (×9): 3 mL via RESPIRATORY_TRACT
  Filled 2013-03-29 (×9): qty 3

## 2013-03-29 NOTE — Progress Notes (Signed)
PULMONARY / CRITICAL CARE MEDICINE   Name: Nicole Garrison MRN: [id number] DOB: [birth year]    ADMISSION DATE:  04/02/2013 CONSULTATION DATE:  03/14/2013  REFERRING MD :  Radford Pax  CHIEF COMPLAINT: Chest pain  BRIEF PATIENT DESCRIPTION:  58 yo female smoker presented with abdominal pain x 1 month, constipation and chest pain.  Found to have hypoxia and multiple pulmonary nodules.  PCCM asked to assume care.  SIGNIFICANT EVENTS: 3/15 Admit, cardiac cath  STUDIES:  3/15 Cardiac cath >> normal coronaries and LV fx 3/15 CT chest >> innumerable diffuse pulmonary nodules with mediastinal/hilar LAN 3/15 Labs >> ESR 30, ACE 28, ds DNA < 1, Scl 70 < 1,  ANCA negative, anti CCP < 2 3/16 CT abd/pelvis >> Markedly thickened area of hypo attenuation within the central uterus measuring up to 4.7 cm thick.  LINES / TUBES: PIV  CULTURES: Pneumococcal Ag 3/15 >> positive Legionella Ag 3/15 >> negative  ANTIBIOTICS: Rocephin 3/15 >> Zithromax 3/15 >>  SUBJECTIVE:  Has more trouble with breathing.  C/o wheezing >> this happens at home also.  VITAL SIGNS: Temp:  [97.7 F (36.5 C)-99.7 F (37.6 C)] 97.7 F (36.5 C) (03/17 0826) Pulse Rate:  [87-134] 121 (03/17 0826) Resp:  [24-37] 34 (03/17 0826) BP: (126-167)/(64-94) 146/91 mmHg (03/17 0826) SpO2:  [80 %-100 %] 80 % (03/17 0826) Weight:  [264 lb 15.9 oz (120.2 kg)] 264 lb 15.9 oz (120.2 kg) (03/17 0420) INTAKE / OUTPUT: Intake/Output     03/16 0701 - 03/17 0700 03/17 0701 - 03/18 0700   P.O. 360    I.V. (mL/kg) 1800 (15)    IV Piggyback     Total Intake(mL/kg) 2160 (18)    Urine (mL/kg/hr) 250 (0.1)    Total Output 250     Net +1910          Urine Occurrence 4 x    Stool Occurrence 3 x      PHYSICAL EXAMINATION: General: no distress Neuro: normal strength HEENT: no sinus tenderness Cardiovascular: regular Lungs: decreased breath sound, b/l exp wheeze, prolonged exhalation Abdomen: soft, non tender, mild distention, + bowel  sounds Musculoskeletal: no edema Skin: no rashes  LABS: CBC Recent Labs     04/07/2013  0740  03/28/13  0720  03/29/13  0420  WBC  21.5*  22.5*  28.6*  HGB  17.7*  16.4*  17.4*  HCT  50.3*  47.4*  50.6*  PLT  299  274  279    Coag's Recent Labs     03/14/2013  0740  APTT  29  INR  1.23    BMET Recent Labs     03/20/2013  0740  03/28/13  0720  03/29/13  0420  NA  132*  134*  134*  K  4.8  4.5  4.8  CL  91*  95*  93*  CO2  _0 BUN  _1 CREATININE  0.85  0.58  0.63  GLUCOSE  105*  121*  108*    Electrolytes Recent Labs     03/29/2013  0740  03/28/13  0720  03/29/13  0420  CALCIUM  10.1  9.5  10.1  MG  2.2  2.1   --   PHOS   --   1.9*   --     Sepsis Markers Recent Labs     03/16/2013  1305  03/28/13  0720  PROCALCITON  0.38  0.55  ABG Recent Labs     04/07/2013  1019  PHART  7.362  PCO2ART  52.8*  PO2ART  122.0*    Liver Enzymes Recent Labs     03/25/2013  0740  03/29/13  0420  AST  30  30  ALT  13  13  ALKPHOS  106  135*  BILITOT  1.0  1.0  ALBUMIN  3.1*  2.8*    Cardiac Enzymes Recent Labs     03/24/2013  0740  03/24/2013  1305  04/02/2013  1933  TROPONINI  <0.30  <0.30  <0.30   Imaging Dg Abd 1 View  03/29/2013   CLINICAL DATA:  Pain and abdominal distention  EXAM: ABDOMEN - 1 VIEW  COMPARISON:  CT ANGIO CHEST W/CM &/OR WO/CM dated 03/25/2013  FINDINGS: Contrast is identified within nondilated renal collecting systems. The abdomen is incompletely included in the field of view, partly due to body habitus and positioning. No evidence for gaseous bowel obstruction. Presence or absence of air-fluid levels or free air is suboptimally evaluated on this supine projection. Bilateral lower lobe pulmonary nodules are partly visualized, not as well as on the dedicated exam performed earlier today. No acute osseous finding.  IMPRESSION: No plain film evidence for bowel obstruction. Please see chest CT report dictated separately today.    Electronically Signed   By: Christiana Pellant M.D.   On: 03/23/2013 13:08   Ct Angio Chest Pe W/cm &/or Wo Cm  04/02/2013   CLINICAL DATA:  Right-sided chest pain, shortness of breath  EXAM: CT ANGIOGRAPHY CHEST WITH CONTRAST  TECHNIQUE: Multidetector CT imaging of the chest was performed using the standard protocol during bolus administration of intravenous contrast. Multiplanar CT image reconstructions and MIPs were obtained to evaluate the vascular anatomy.  CONTRAST:  28mL OMNIPAQUE IOHEXOL 350 MG/ML SOLN  COMPARISON:  DG CHEST 1V PORT dated 04/10/2013  FINDINGS: There are innumerable pulmonary nodules throughout both lungs, representative dominant right middle lobe pulmonary nodule measuring 1.4 cm image 45. Trace pleural fluid or thickening noted. There is extensive dependent compressive atelectasis.  The thyroid is normal in appearance where visualized. Great vessels are normal in caliber. Mild atheromatous aortic calcification without aneurysm. Allowing for mild bolus inhomogeneity due to timing, there is no filling defect up to and involving the third order pulmonary arteries to suggest acute pulmonary embolism. Subsegmental emboli may be obscured by bolus inhomogeneity particularly at the lung bases. Heart size is at upper limits of normal. Bilateral hilar lymphadenopathy is noted, 1 cm on the left, 1.4 cm on the right. Sub carinal confluent lymphadenopathy measures 2.0 cm image 41. Pretracheal nodal conglomerate measures 1.2 cm image 30. No axillary lymphadenopathy. Central airways are patent.  No pericardial effusion. Incomplete imaging of the upper abdomen demonstrates incomplete visualization of the adrenal glands but no mass identified. Multilevel disc degenerative change noted in the spine. Schmorl's node formation is noted at multiple levels. No lytic or sclerotic osseous lesion is identified.  Review of the MIP images confirms the above findings.  IMPRESSION: Innumerable diffuse pulmonary nodules  with mediastinal and hilar lymphadenopathy. Differential consideration includes hematogenous spread of metastatic disease from possible abdominal or pelvic primary neoplasm, miliary tuberculosis, other infection such as fungal infection, and sarcoid. If further evaluation for possible underlying malignancy is indicated, CT abdomen/pelvis with contrast would be recommended for further evaluation.  Allowing for mild inhomogeneity of the contrast bolus predominantly at the lung bases, there is no CT evidence for acute pulmonary embolism up to  and including the third order pulmonary arteries.   Electronically Signed   By: Conchita Paris M.D.   On: 03/19/2013 13:03   Ct Abdomen Pelvis W Contrast  03/28/2013   CLINICAL DATA:  Lower abdominal pain, vaginal bleeding, constipation, hypertension, diabetes, abnormal CT chest with innumerable pulmonary nodules  EXAM: CT ABDOMEN AND PELVIS WITH CONTRAST  TECHNIQUE: Multidetector CT imaging of the abdomen and pelvis was performed using the standard protocol following bolus administration of intravenous contrast. Sagittal and coronal MPR images reconstructed from axial data set.  CONTRAST:  136m OMNIPAQUE IOHEXOL 300 MG/ML SOLN. Dilute oral contrast.  COMPARISON:  CT chest 03/19/2013  FINDINGS: Numerous nodules at bilateral lung bases.  Bibasilar atelectasis and minimal right pleural effusion.  Liver, spleen, pancreas, kidneys, and adrenal glands normal appearance.  Enlarged uterus with large area of central low attenuation up to 4.7 cm thick, tumor not excluded.  Area of low attenuation extends to the right posterolateral uterine margin.  No definite extra uterine extension identified.  Unremarkable bladder, ureters, adnexae, and appendix.  Normal appearing stomach and bowel loops.  Single upper normal size portacaval lymph node 10 mm short axis image 27.  No additional mass, adenopathy, free fluid or inflammatory process.  Osseous structures unremarkable.  IMPRESSION:  Numerous bibasilar pulmonary nodules, please refer to discussion in preceeding CT chest exam.  Markedly thickened area of hypo attenuation within the central uterus measuring up to 4.7 cm thick.  Patient's menstrual status is unknown but this represents an abnormal thickened endometrial complex regardless of whether patient is pre or post menopausal.  Tissue diagnosis recommended to exclude endometrial malignancy.   Electronically Signed   By: MLavonia DanaM.D.   On: 03/28/2013 16:53   Dg Chest Port 1 View  03/28/2013   CLINICAL DATA:  Respiratory distress  EXAM: PORTABLE CHEST - 1 VIEW  COMPARISON:  04/01/2013  FINDINGS: Cardiac shadow is stable. Miliary pattern is noted throughout both lungs and stable. No new focal abnormality is seen.  IMPRESSION: No change from the previous exam.   Electronically Signed   By: MInez CatalinaM.D.   On: 03/28/2013 07:24    ASSESSMENT / PLAN:  A: Acute hypoxic respiratory failure 2nd to multiple pulmonary nodules >> likely metastatic lesions.  Enlarged endometrium on CT abd/pelvis with hx of vaginal bleeding. P: Oxygen to keep SpO2 > 92% Gyn consulted 3/17  A: Chest pain >> cardiac cath normal. P: PRN pain meds  A: Hx of tobacco abuse and developed wheezing on 3/16. P: Add prednisone, BD's 3/17  A: Urine pneumococcal Ag positive. P: Day 3 of rocephin, zithromax  A: Hx of HTN. P: Monitor blood pressure   A: Polycythemia. P: F/u CBC  A: Diet controlled DM. P: Monitor blood sugar on BMET  A: Dysuria. P: Continue pyridium   VChesley Mires MD [place]3/17/2015, 8:38 AM Pager:  [phone] 3pm call: [phone]

## 2013-03-29 NOTE — Progress Notes (Signed)
I was called to see this 58 yo MW G0 who has been having PMB for about 2 months. She went through menopause in 2004. She has not seen a gynecologist for about 20 years.   Consent signed, time out done Cervix prepped with betadine and grasped with a single tooth tenaculum Uterus sounded to 9 cm Pipelle used for 2 passes with a moderate amount of tissue obtained. She tolerated the procedure well.  A/P. PMB- I will send the [facility] as well as a pap smear to rule out cancer.

## 2013-03-29 NOTE — Progress Notes (Signed)
Patient has been complaining all night of sharp abdominal pain and shortness of breath. Was placed on 100% non rebreather for several hours, pain medicine given every 2 hours with no relief. Vaginal bleeding noted .

## 2013-03-29 NOTE — Progress Notes (Signed)
Utilization Review Completed.  

## 2013-03-30 ENCOUNTER — Inpatient Hospital Stay (HOSPITAL_COMMUNITY): Payer: 59

## 2013-03-30 LAB — URINALYSIS, ROUTINE W REFLEX MICROSCOPIC
GLUCOSE, UA: NEGATIVE mg/dL
Hgb urine dipstick: NEGATIVE
KETONES UR: NEGATIVE mg/dL
LEUKOCYTES UA: NEGATIVE
Nitrite: POSITIVE — AB
Protein, ur: 30 mg/dL — AB
Specific Gravity, Urine: 1.027 (ref 1.005–1.030)
UROBILINOGEN UA: 1 mg/dL (ref 0.0–1.0)
pH: 5 (ref 5.0–8.0)

## 2013-03-30 LAB — CBC WITH DIFFERENTIAL/PLATELET
BASOS ABS: 0 10*3/uL (ref 0.0–0.1)
BASOS PCT: 0 % (ref 0–1)
EOS PCT: 0 % (ref 0–5)
Eosinophils Absolute: 0 10*3/uL (ref 0.0–0.7)
HEMATOCRIT: 45.7 % (ref 36.0–46.0)
HEMOGLOBIN: 15.9 g/dL — AB (ref 12.0–15.0)
LYMPHS ABS: 1.3 10*3/uL (ref 0.7–4.0)
Lymphocytes Relative: 5 % — ABNORMAL LOW (ref 12–46)
MCH: 33.4 pg (ref 26.0–34.0)
MCHC: 34.8 g/dL (ref 30.0–36.0)
MCV: 96 fL (ref 78.0–100.0)
MONOS PCT: 10 % (ref 3–12)
Monocytes Absolute: 2.6 10*3/uL — ABNORMAL HIGH (ref 0.1–1.0)
NEUTROS ABS: 21.7 10*3/uL — AB (ref 1.7–7.7)
Neutrophils Relative %: 85 % — ABNORMAL HIGH (ref 43–77)
Platelets: 269 10*3/uL (ref 150–400)
RBC: 4.76 MIL/uL (ref 3.87–5.11)
RDW: 13.1 % (ref 11.5–15.5)
WBC: 25.6 10*3/uL — AB (ref 4.0–10.5)

## 2013-03-30 LAB — GLUCOSE, CAPILLARY
GLUCOSE-CAPILLARY: 100 mg/dL — AB (ref 70–99)
GLUCOSE-CAPILLARY: 117 mg/dL — AB (ref 70–99)
Glucose-Capillary: 139 mg/dL — ABNORMAL HIGH (ref 70–99)

## 2013-03-30 LAB — POCT I-STAT 3, ART BLOOD GAS (G3+)
Acid-Base Excess: 1 mmol/L (ref 0.0–2.0)
BICARBONATE: 28 meq/L — AB (ref 20.0–24.0)
Bicarbonate: 32.1 mEq/L — ABNORMAL HIGH (ref 20.0–24.0)
O2 SAT: 92 %
O2 Saturation: 98 %
PCO2 ART: 88.2 mmHg — AB (ref 35.0–45.0)
PO2 ART: 71 mmHg — AB (ref 80.0–100.0)
Patient temperature: 98
TCO2: 35 mmol/L (ref 0–100)
TCO2: 30 mmol/L (ref 0–100)
pCO2 arterial: 53 mmHg — ABNORMAL HIGH (ref 35.0–45.0)
pH, Arterial: 7.167 — CL (ref 7.350–7.450)
pH, Arterial: 7.335 — ABNORMAL LOW (ref 7.350–7.450)
pO2, Arterial: 150 mmHg — ABNORMAL HIGH (ref 80.0–100.0)

## 2013-03-30 LAB — BASIC METABOLIC PANEL
BUN: 15 mg/dL (ref 6–23)
CHLORIDE: 96 meq/L (ref 96–112)
CO2: 28 meq/L (ref 19–32)
CREATININE: 0.5 mg/dL (ref 0.50–1.10)
Calcium: 10 mg/dL (ref 8.4–10.5)
GFR calc Af Amer: >90 mL/min (ref >90)
GFR calc non Af Amer: >90 mL/min (ref >90)
Glucose, Bld: 110 mg/dL — ABNORMAL HIGH (ref 70–99)
Potassium: 4.5 mEq/L (ref 3.7–5.3)
Sodium: 137 mEq/L (ref 137–147)

## 2013-03-30 LAB — TRIGLYCERIDES: Triglycerides: 105 mg/dL (ref <150)

## 2013-03-30 LAB — URINE MICROSCOPIC-ADD ON

## 2013-03-30 MED ORDER — SUCCINYLCHOLINE CHLORIDE 20 MG/ML IJ SOLN
INTRAMUSCULAR | Status: AC
  Filled 2013-03-30: qty 1

## 2013-03-30 MED ORDER — FENTANYL CITRATE 0.05 MG/ML IJ SOLN
INTRAMUSCULAR | Status: AC
  Administered 2013-03-30: 50 ug
  Filled 2013-03-30: qty 2

## 2013-03-30 MED ORDER — MIDAZOLAM HCL 2 MG/2ML IJ SOLN
2.0000 mg | INTRAMUSCULAR | Status: AC | PRN
  Administered 2013-03-30 (×3): 2 mg via INTRAVENOUS
  Filled 2013-03-30: qty 2

## 2013-03-30 MED ORDER — FENTANYL CITRATE 0.05 MG/ML IJ SOLN
100.0000 ug | INTRAMUSCULAR | Status: DC | PRN
  Administered 2013-03-30 – 2013-04-02 (×17): 100 ug via INTRAVENOUS
  Filled 2013-03-30 (×15): qty 2

## 2013-03-30 MED ORDER — ONDANSETRON HCL 4 MG/2ML IJ SOLN
4.0000 mg | Freq: Three times a day (TID) | INTRAMUSCULAR | Status: DC | PRN
  Administered 2013-03-30 (×2): 4 mg via INTRAVENOUS
  Filled 2013-03-30: qty 2

## 2013-03-30 MED ORDER — PANTOPRAZOLE SODIUM 40 MG IV SOLR
40.0000 mg | INTRAVENOUS | Status: DC
  Administered 2013-03-30 – 2013-04-02 (×4): 40 mg via INTRAVENOUS
  Filled 2013-03-30 (×5): qty 40

## 2013-03-30 MED ORDER — ETOMIDATE 2 MG/ML IV SOLN
INTRAVENOUS | Status: AC
  Administered 2013-03-30: 20 mg
  Filled 2013-03-30: qty 20

## 2013-03-30 MED ORDER — MIDAZOLAM HCL 2 MG/2ML IJ SOLN
INTRAMUSCULAR | Status: AC
  Filled 2013-03-30: qty 2

## 2013-03-30 MED ORDER — MIDAZOLAM HCL 2 MG/2ML IJ SOLN
INTRAMUSCULAR | Status: AC
  Administered 2013-03-30: 2 mg
  Filled 2013-03-30: qty 4

## 2013-03-30 MED ORDER — LIDOCAINE HCL (CARDIAC) 20 MG/ML IV SOLN
INTRAVENOUS | Status: AC
  Filled 2013-03-30: qty 5

## 2013-03-30 MED ORDER — MIDAZOLAM HCL 2 MG/2ML IJ SOLN
2.0000 mg | INTRAMUSCULAR | Status: DC | PRN
  Filled 2013-03-30: qty 2

## 2013-03-30 MED ORDER — ONDANSETRON HCL 4 MG/2ML IJ SOLN
INTRAMUSCULAR | Status: AC
  Filled 2013-03-30: qty 2

## 2013-03-30 MED ORDER — FENTANYL CITRATE 0.05 MG/ML IJ SOLN
INTRAMUSCULAR | Status: AC
  Filled 2013-03-30: qty 2

## 2013-03-30 MED ORDER — METHYLPREDNISOLONE SODIUM SUCC 40 MG IJ SOLR
40.0000 mg | Freq: Two times a day (BID) | INTRAMUSCULAR | Status: DC
  Administered 2013-03-30 – 2013-04-04 (×11): 40 mg via INTRAVENOUS
  Filled 2013-03-30 (×13): qty 1

## 2013-03-30 MED ORDER — ROCURONIUM BROMIDE 50 MG/5ML IV SOLN
INTRAVENOUS | Status: AC
  Filled 2013-03-30: qty 2

## 2013-03-30 MED ORDER — PNEUMOCOCCAL VAC POLYVALENT 25 MCG/0.5ML IJ INJ
0.5000 mL | INJECTION | INTRAMUSCULAR | Status: AC
  Administered 2013-03-31: 0.5 mL via INTRAMUSCULAR
  Filled 2013-03-30: qty 0.5

## 2013-03-30 MED ORDER — BIOTENE DRY MOUTH MT LIQD
15.0000 mL | Freq: Four times a day (QID) | OROMUCOSAL | Status: DC
  Administered 2013-03-30 – 2013-04-05 (×25): 15 mL via OROMUCOSAL

## 2013-03-30 MED ORDER — INFLUENZA VAC SPLIT QUAD 0.5 ML IM SUSP
0.5000 mL | INTRAMUSCULAR | Status: AC
  Administered 2013-03-30: 0.5 mL via INTRAMUSCULAR
  Filled 2013-03-30 (×2): qty 0.5

## 2013-03-30 MED ORDER — CHLORHEXIDINE GLUCONATE 0.12 % MT SOLN
15.0000 mL | Freq: Two times a day (BID) | OROMUCOSAL | Status: DC
  Administered 2013-03-30 – 2013-04-05 (×13): 15 mL via OROMUCOSAL
  Filled 2013-03-30 (×13): qty 15

## 2013-03-30 MED ORDER — PROPOFOL 10 MG/ML IV EMUL
0.0000 ug/kg/min | INTRAVENOUS | Status: DC
  Administered 2013-03-30 (×2): 40 ug/kg/min via INTRAVENOUS
  Administered 2013-03-30: 30 ug/kg/min via INTRAVENOUS
  Administered 2013-03-30 – 2013-03-31 (×8): 40 ug/kg/min via INTRAVENOUS
  Administered 2013-04-01: 30 ug/kg/min via INTRAVENOUS
  Administered 2013-04-01: 40 ug/kg/min via INTRAVENOUS
  Administered 2013-04-01: 50 ug/kg/min via INTRAVENOUS
  Administered 2013-04-01 (×4): 40 ug/kg/min via INTRAVENOUS
  Administered 2013-04-02 (×4): 50 ug/kg/min via INTRAVENOUS
  Administered 2013-04-02 (×2): 49.986 ug/kg/min via INTRAVENOUS
  Administered 2013-04-02 (×2): 50 ug/kg/min via INTRAVENOUS
  Administered 2013-04-03: 49.986 ug/kg/min via INTRAVENOUS
  Administered 2013-04-03 (×3): 50 ug/kg/min via INTRAVENOUS
  Administered 2013-04-03: 49.986 ug/kg/min via INTRAVENOUS
  Administered 2013-04-04: 35 ug/kg/min via INTRAVENOUS
  Administered 2013-04-04: 40 ug/kg/min via INTRAVENOUS
  Administered 2013-04-04: 35 ug/kg/min via INTRAVENOUS
  Administered 2013-04-04: 25.062 ug/kg/min via INTRAVENOUS
  Administered 2013-04-04: 40 ug/kg/min via INTRAVENOUS
  Administered 2013-04-04: 19.995 ug/kg/min via INTRAVENOUS
  Administered 2013-04-05 (×2): 35 ug/kg/min via INTRAVENOUS
  Filled 2013-03-30 (×43): qty 100

## 2013-03-30 NOTE — Progress Notes (Signed)
Patient transported with Respiratory Therapy to 2MW (2100) s/p Intubation and Central Line insertion.  Update of patient given to 2MW nurse.  Family also updated of condition and placed in waiting area.

## 2013-03-30 NOTE — Procedures (Signed)
Intubation Procedure Note Denetta Fei [id number] 1955/10/13  Procedure: Intubation Indications: Respiratory insufficiency  Procedure Details Consent: Risks of procedure as well as the alternatives and risks of each were explained to the (patient/caregiver).  Consent for procedure obtained. Time Out: Verified patient identification, verified procedure, site/side was marked, verified correct patient position, special equipment/implants available, medications/allergies/relevent history reviewed, required imaging and test results available.  Performed  Maximum sterile technique was used including gloves and hand hygiene.  MAC and 3    Evaluation Hemodynamic Status: BP stable throughout; O2 sats: transiently fell during during procedure Patient's Current Condition: stable Complications: No apparent complications Patient did tolerate procedure well. Chest X-ray ordered to verify placement.  CXR: pending.  Chesley Mires, MD [facility] Pulmonary/Critical Care 03/30/2013, 10:45 AM Pager:  [phone] After 3pm call: [phone]

## 2013-03-30 NOTE — Progress Notes (Signed)
Patient ID: Nicole Garrison, female   DOB: 1955-06-19, 58 y.o.   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 Advised primary team. Pt. Is going on vent right now.  Consulted Dr. Alycia Rossetti form GYN/ONC. She thinks lung biopsy might be appropriate. She will try to see pt. Today and discuss possibilities of treatment with pt and family.

## 2013-03-30 NOTE — Procedures (Signed)
Arterial Catheter Insertion Procedure Note Nicole Garrison [id number] 1955-03-22  Procedure: Insertion of Arterial Catheter  Indications: Blood pressure monitoring and Frequent blood sampling  Procedure Details Consent: Unable to obtain consent because of altered level of consciousness. Time Out: Verified patient identification, verified procedure, site/side was marked, verified correct patient position, special equipment/implants available, medications/allergies/relevent history reviewed, required imaging and test results available.  Performed  Maximum sterile technique was used including antiseptics, cap, gloves, hand hygiene, mask and sheet. Skin prep: Chlorhexidine; local anesthetic administered 20 gauge catheter was inserted into right radial artery using the Seldinger technique.  Evaluation Blood flow good; BP tracing good. Complications: No apparent complications.   Montey Hora, PA - [facility] [place] [facility] & Critical [facility] Pgr: [phone]  or [phone]  Chesley Mires, MD [place] 03/30/2013, 3:35 PM Pager:  [phone] After 3pm call: [phone]

## 2013-03-30 NOTE — Progress Notes (Signed)
[place] Progress Note Patient Name: Kamry Faraci DOB: [birth year] MRN: [id number]  Date of Service  03/30/2013   HPI/Events of Note  Nausea   eICU Interventions  Plan: PRN zofran   Intervention Category Minor Interventions: Routine modifications to care plan (e.g. PRN medications for pain, fever)  DETERDING,ELIZABETH 03/30/2013, 2:08 AM

## 2013-03-30 NOTE — Progress Notes (Signed)
Called to patient's room by family d/t complaint of difficulty breathing at approximately 1010.  On assessment patient noted to be diaphoretic, HR 140s, increased RR 30s and very agitated.  Notified Dr. Halford Chessman of change in patient's breathing and that she was ashen/gray with much difficulty breathing.  Dr. Halford Chessman came to bedside to assess patient and speak with family.  Orders were given for intubation and initiated.

## 2013-03-30 NOTE — Procedures (Signed)
Central Venous Catheter Insertion Procedure Note Nicole Garrison [id number] [birth year]  Procedure: Insertion of Central Venous Catheter Indications: Assessment of intravascular volume, Drug and/or fluid administration and Frequent blood sampling  Procedure Details Consent: Risks of procedure as well as the alternatives and risks of each were explained to the (patient/caregiver).  Consent for procedure obtained. Time Out: Verified patient identification, verified procedure, site/side was marked, verified correct patient position, special equipment/implants available, medications/allergies/relevent history reviewed, required imaging and test results available.  Performed  Maximum sterile technique was used including antiseptics, cap, gloves, gown, hand hygiene, mask and sheet. Skin prep: Chlorhexidine; local anesthetic administered A antimicrobial bonded/coated triple lumen catheter was placed in the right internal jugular vein using the Seldinger technique. Ultrasound guidance used.yes Catheter placed to 16 cm. Blood aspirated via all 3 ports and then flushed x 3. Line sutured x 2 and dressing applied.  Evaluation Blood flow good Complications: No apparent complications Patient did tolerate procedure well. Chest X-ray ordered to verify placement.  CXR: pending.  Georgann Housekeeper, ACNP [place] [facility]/Critical Care Pager [phone] or [phone]  I was present for procedure.  Chesley Mires, MD [facility] Pulmonary/Critical Care 03/30/2013, 11:19 AM Pager:  [phone] After 3pm call: [phone]

## 2013-03-30 NOTE — Progress Notes (Signed)
PULMONARY / CRITICAL CARE MEDICINE   Name: Nicole Garrison MRN: [id number] DOB: 1955-03-09    ADMISSION DATE:  04/12/2013 CONSULTATION DATE:  03/19/2013  REFERRING MD :  Radford Pax  CHIEF COMPLAINT: Chest pain  BRIEF PATIENT DESCRIPTION:  58 yo female smoker presented with abdominal pain x 1 month, constipation and chest pain.  Found to have hypoxia and multiple pulmonary nodules.  PCCM asked to assume care.  SIGNIFICANT EVENTS: 3/15 Admit, cardiac cath 3/17 Endometrial Bx 3/18 VDRF, to ICU  STUDIES:  3/15 Cardiac cath >> normal coronaries and LV fx 3/15 CT chest >> innumerable diffuse pulmonary nodules with mediastinal/hilar LAN 3/15 Labs >> ESR 30, ACE 28, ds DNA < 1, Scl 70 < 1,  ANCA negative, anti CCP < 2 3/16 CT abd/pelvis >> Markedly thickened area of hypo attenuation within the central uterus measuring up to 4.7 cm thick.  LINES / TUBES: ETT 3/18>>> R IJ CVL 3/18>>>  CULTURES: Pneumococcal Ag 3/15 >> positive Legionella Ag 3/15 >> negative  RVP 3/15>>> neg   ANTIBIOTICS: Rocephin 3/15 >> Zithromax 3/15 >>  SUBJECTIVE:  Acutely decompensated from resp standpoint requiring emergent intubation.   VITAL SIGNS: Temp:  [97.4 F (36.3 C)-98.4 F (36.9 C)] 98.3 F (36.8 C) (03/18 0816) Pulse Rate:  [32-116] 116 (03/18 0816) Resp:  [17-33] 24 (03/18 0816) BP: (133-153)/(61-87) 142/78 mmHg (03/18 0816) SpO2:  [90 %-99 %] 92 % (03/18 0816) FiO2 (%):  [100 %] 100 % (03/18 1047) Weight:  [268 lb 4.8 oz (121.7 kg)] 268 lb 4.8 oz (121.7 kg) (03/18 0400) INTAKE / OUTPUT: Intake/Output     03/17 0701 - 03/18 0700 03/18 0701 - 03/19 0700   P.O. 360    I.V. (mL/kg) 2200 (18.1) 100 (0.8)   IV Piggyback 350    Total Intake(mL/kg) 2910 (23.9) 100 (0.8)   Urine (mL/kg/hr) 1150 (0.4)    Total Output 1150     Net +1760 +100        Stool Occurrence 1 x      PHYSICAL EXAMINATION: General: acute distress, improved post intubation Neuro:  now sedated, RASS -1 HEENT: no sinus  tenderness Cardiovascular: regular, tachy Lungs: decreased breath sound, b/l exp wheeze, prolonged exhalation Abdomen: soft, non tender, mild distention, + bowel sounds Musculoskeletal: no edema Skin: no rashes  LABS: CBC Recent Labs     03/28/13  0720  03/29/13  0420  03/30/13  0230  WBC  22.5*  28.6*  25.6*  HGB  16.4*  17.4*  15.9*  HCT  47.4*  50.6*  45.7  PLT  274  279  269    Coag's No results found for this basename: APTT, INR,  in the last 72 hours  BMET Recent Labs     03/28/13  0720  03/29/13  0420  03/30/13  0230  NA  134*  134*  137  K  4.5  4.8  4.5  CL  95*  93*  96  CO2  $Re'27  25  28  'XGi$ BUN  $Re'19  18  15  'Nhz$ CREATININE  0.58  0.63  0.50  GLUCOSE  121*  108*  110*    Electrolytes Recent Labs     03/28/13  0720  03/29/13  0420  03/30/13  0230  CALCIUM  9.5  10.1  10.0  MG  2.1   --    --   PHOS  1.9*   --    --     Sepsis Markers Recent Labs  03/31/2013  1305  03/28/13  0720  PROCALCITON  0.38  0.55    ABG No results found for this basename: PHART, PCO2ART, PO2ART,  in the last 72 hours  Liver Enzymes Recent Labs     03/29/13  0420  AST  30  ALT  13  ALKPHOS  135*  BILITOT  1.0  ALBUMIN  2.8*    Cardiac Enzymes Recent Labs     03/14/2013  1305  03/28/2013  1933  TROPONINI  <0.30  <0.30   Imaging Ct Abdomen Pelvis W Contrast  03/28/2013   CLINICAL DATA:  Lower abdominal pain, vaginal bleeding, constipation, hypertension, diabetes, abnormal CT chest with innumerable pulmonary nodules  EXAM: CT ABDOMEN AND PELVIS WITH CONTRAST  TECHNIQUE: Multidetector CT imaging of the abdomen and pelvis was performed using the standard protocol following bolus administration of intravenous contrast. Sagittal and coronal MPR images reconstructed from axial data set.  CONTRAST:  153mL OMNIPAQUE IOHEXOL 300 MG/ML SOLN. Dilute oral contrast.  COMPARISON:  CT chest 03/17/2013  FINDINGS: Numerous nodules at bilateral lung bases.  Bibasilar atelectasis and  minimal right pleural effusion.  Liver, spleen, pancreas, kidneys, and adrenal glands normal appearance.  Enlarged uterus with large area of central low attenuation up to 4.7 cm thick, tumor not excluded.  Area of low attenuation extends to the right posterolateral uterine margin.  No definite extra uterine extension identified.  Unremarkable bladder, ureters, adnexae, and appendix.  Normal appearing stomach and bowel loops.  Single upper normal size portacaval lymph node 10 mm short axis image 27.  No additional mass, adenopathy, free fluid or inflammatory process.  Osseous structures unremarkable.  IMPRESSION: Numerous bibasilar pulmonary nodules, please refer to discussion in preceeding CT chest exam.  Markedly thickened area of hypo attenuation within the central uterus measuring up to 4.7 cm thick.  Patient's menstrual status is unknown but this represents an abnormal thickened endometrial complex regardless of whether patient is pre or post menopausal.  Tissue diagnosis recommended to exclude endometrial malignancy.   Electronically Signed   By: Lavonia Dana M.D.   On: 03/28/2013 16:53    ASSESSMENT / PLAN:  PULMONARY:  Acute hypoxic respiratory failure 2nd to multiple pulmonary nodules >> likely metastatic lesions.  Enlarged endometrium on CT abd/pelvis with hx of vaginal bleeding. ?underlying COPD P: Oxygen to keep SpO2 > 92% Gyn following - unusual presentation for metastatic gyn malignancy - ?sarcoma  ?FOB for bx pulm nodules  Vent support - 8cc/kg  F/u CXR  ABG Continue prednisone, BD's    CARDIOLOGY:  Chest pain >> cardiac cath normal. Tachycardia  Hx HTN P: PRN pain meds   RENAL:  Hyponatremia - mild  P:  F/u chem    ENDOCRINE:  Diet controlled DM. P:  Monitor glucose on chem    GASTROINTESTINAL  No active issue  P:  Consider TF in am 3/19   INFECTIOUS:  Urine pneumococcal Ag positive. P: Day 4 of rocephin,  zithromax   HEMATOLOGY: A: Polycythemia. P: F/u CBC   NEURO:  P:  PRN sedation while on vent Daily Osvaldo Shipper, NP 03/30/2013  11:06 AM Pager: [phone] or [phone]  Reviewed above, examined.  She developed respiratory failure requiring intubation.  She will be transferred to ICU.  Await results of endometrial bx 3/17.  D/w Gyn.  Updated pt's family.  Explained that main concern is that she has cancer, and if so her ability to be liberated from ventilator is uncertain.  CC time 60 minutes.  Chesley Mires, MD [facility] Pulmonary/Critical Care 03/30/2013, 11:21 AM Pager:  [phone] After 3pm call: [phone]

## 2013-03-30 NOTE — Consult Note (Signed)
Consult Note: Gyn-Onc  Nicole Garrison 58 y.o. female  CC:  Chief Complaint  Patient presents with  . Code STEMI    HPI: Patient intubated and sedated. No family at bedside.  History obtained from medical records. Dr. Kennon Rounds asked me to come see the patient as endometrial biopsy revealed endometrial cancer. 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  58 yo obese (257 lbs) female , 1 ppd smoker age 57 till 1 week ago, who has had abdominal/back pain/hematuria x 1-3 month and has been on narcotics with bed rest due to x 7 days with recent constipation. She awoke 03/25/13 2200 with acute mid sternal chest pain thought to be cardiac in origin. Left heart cath 3/15 was negative and chest x ray revealed miliary pattern, she developed increasing hypoxia refractory to O2. Of note she was started on narcotics for chronic abd.back pain 1 week ago. Due to negative cardiac cath and extensive radiographic radiographic changes on c x r And hypoxemia CT angio chest was done - PE negative but showed diffuse bilateral nodular densities suggestive of malignancy. Currently intubated on a propofol drip.   FINDINGS:  There are innumerable pulmonary nodules throughout both lungs, representative dominant right middle lobe pulmonary nodule measuring 1.4 cm. Trace pleural fluid or thickening noted. There is extensive dependent compressive atelectasis. The thyroid is normal in appearance where visualized. Great vessels are normal in caliber. Mild atheromatous aortic calcification without aneurysm. Allowing for mild bolus inhomogeneity due to timing, there is no filling defect up to and involving the third order pulmonary arteries to suggest acute pulmonary embolism. Subsegmental emboli may be obscured by bolus inhomogeneity particularly at the lung bases. Heart size is at upper limits of normal. Bilateral hilar lymphadenopathy is noted, 1 cm on the left, 1.4 cm on the right. Sub  carinal confluent lymphadenopathy measures 2.0 cm. Pretracheal nodal conglomerate measures 1.2 cm . No axillary lymphadenopathy. Central airways are patent. No pericardial effusion. Incomplete imaging of the upper abdomen demonstrates incomplete visualization of the adrenal glands but no mass identified. Multilevel disc degenerative change noted in the spine. Schmorl's node formation is noted at multiple levels. No lytic or sclerotic osseous lesion is identified.  Review of the MIP images confirms the above findings.  IMPRESSION:  Innumerable diffuse pulmonary nodules with mediastinal and hilar lymphadenopathy. Differential consideration includes hematogenous spread of metastatic disease from possible abdominal or pelvic primary neoplasm, miliary tuberculosis, other infection such as fungal infection, and sarcoid. If further evaluation for possible underlying malignancy is indicated, CT abdomen/pelvis with contrast would be recommended for further evaluation. Allowing for mild inhomogeneity of the contrast bolus predominantly at the lung bases, there is no CT evidence for acute pulmonary embolism up to and including the third order pulmonary arteries.   CT ABDOMEN AND PELVIS WITH CONTRAST  TECHNIQUE:  Multidetector CT imaging of the abdomen and pelvis was performed using the standard protocol following bolus administration of intravenous contrast. Sagittal and coronal MPR images reconstructed from axial data set.  CONTRAST: 149m OMNIPAQUE IOHEXOL 300 MG/ML SOLN. Dilute oral contrast.  COMPARISON: CT chest 04/11/2013  FINDINGS:  Numerous nodules at bilateral lung bases. Bibasilar atelectasis and minimal right pleural effusion. Liver, spleen, pancreas, kidneys, and adrenal glands normal appearance.  Enlarged uterus with large area of central low attenuation up to 4.7 cm thick, tumor not excluded. Area of low attenuation extends to the right posterolateral uterine margin. No definite extra uterine  extension identified. Unremarkable bladder, ureters, adnexae,  and appendix. Normal appearing stomach and bowel loops. Single upper normal size portacaval lymph node 10 mm short axis. No additional mass, adenopathy, free fluid or inflammatory process. Osseous structures unremarkable.  IMPRESSION:  Numerous bibasilar pulmonary nodules, please refer to discussion in preceeding CT chest exam. Markedly thickened area of hypo attenuation within the central uterus measuring up to 4.7 cm thick. Patient's menstrual status is unknown but this represents an abnormal thickened endometrial complex regardless of whether patient is pre or post menopausal. Tissue diagnosis recommended to exclude endometrial malignancy.   Current Meds: _0 @  Allergy:  Allergies  Allergen Reactions  . Benadryl [Diphenhydramine Hcl (Sleep)] Hypertension  . Codeine Hives and Other (See Comments)    Headaches    Social Hx:   History   Social History  . Marital Status: Married    Spouse Name: N/A    Number of Children: N/A  . Years of Education: N/A   Occupational History  . Not on file.   Social History Main Topics  . Smoking status: Former Research scientist (life sciences)  . Smokeless tobacco: Not on file  . Alcohol Use: No  . Drug Use: No  . Sexual Activity: Not on file   Other Topics Concern  . Not on file   Social History Narrative  . No narrative on file    Past Surgical Hx: History reviewed. No pertinent past surgical history.  Past Medical Hx:  Past Medical History  Diagnosis Date  . Chronic pain     back   . Borderline diabetes   . Diabetes mellitus without complication   . Hypertension   . Asthma     Oncology Hx:   No history exists.    Family Hx: No family history on file.  Vitals:  Blood pressure 111/64, pulse 89, temperature 98.3 F (36.8 C), temperature source Oral, resp. rate 30, height _1  (1.626 m), weight 268 lb 4.8 oz (121.7 kg), SpO2 94.00%.  Physical Exam: Intubated, sedated.  Lungs:  Course breath sounds.  CV: RRR  Abdomen: Obese, no palpable masses.  Extremeties: SCDs  Assessment/Plan: 58 year old with what appears to be pulmonary metastasis and a preliminary read of endometrial cancer. We were asked to render an opinion regarding treatment.  At this point without final pathology, we cannot render a complete opinion with regards to treatment recommendations. This metastatic pattern is a bit unusual for endometrial cancer to have widely metastatic pulmonary disease in the absence of more diffuse abdominal or pelvic disease. I would recommend FNA or bronchoscopically directed biopsy when the patient's condition allows. At this time, she is not a candidate for either surgery or chemotherapy until her acute process improves.  I have met with her husband and outlined this.  Her preliminary pathology is of a FIGO grade 1 endometrial cancer, which again typically does not cause metastatic disease without more pelvic disease.  If this is consistent with metastatic endometrial cancer options include primary chemotherapy or surgery to remove the primary organ (hysterectomy and BSO) followed by chemotherapy. The final pathology is important to determine best course of action.  We will be happy to follow her progress and follow up her final pathology. I am available at [phone] (pager) and on site, Joylene John is our NP at [phone].    Thank you for the opportunity to participate in the care of this patient.  Duvall Comes A., MD 03/30/2013, 3:52 PM

## 2013-03-31 ENCOUNTER — Inpatient Hospital Stay (HOSPITAL_COMMUNITY): Payer: 59

## 2013-03-31 DIAGNOSIS — R0902 Hypoxemia: Secondary | ICD-10-CM

## 2013-03-31 LAB — GLUCOSE, CAPILLARY
GLUCOSE-CAPILLARY: 140 mg/dL — AB (ref 70–99)
GLUCOSE-CAPILLARY: 137 mg/dL — AB (ref 70–99)
GLUCOSE-CAPILLARY: 128 mg/dL — AB (ref 70–99)
GLUCOSE-CAPILLARY: 165 mg/dL — AB (ref 70–99)
Glucose-Capillary: 140 mg/dL — ABNORMAL HIGH (ref 70–99)
Glucose-Capillary: 130 mg/dL — ABNORMAL HIGH (ref 70–99)

## 2013-03-31 LAB — BASIC METABOLIC PANEL
BUN: 35 mg/dL — ABNORMAL HIGH (ref 6–23)
CO2: 24 mEq/L (ref 19–32)
CREATININE: 1.04 mg/dL (ref 0.50–1.10)
Calcium: 9.9 mg/dL (ref 8.4–10.5)
Chloride: 97 mEq/L (ref 96–112)
GFR calc Af Amer: 67 mL/min — ABNORMAL LOW (ref >90)
GFR, EST NON AFRICAN AMERICAN: 58 mL/min — AB (ref >90)
GLUCOSE: 149 mg/dL — AB (ref 70–99)
POTASSIUM: 4.4 meq/L (ref 3.7–5.3)
Sodium: 136 mEq/L — ABNORMAL LOW (ref 137–147)

## 2013-03-31 LAB — PHOSPHORUS
PHOSPHORUS: 2.1 mg/dL — AB (ref 2.3–4.6)
Phosphorus: 1.9 mg/dL — ABNORMAL LOW (ref 2.3–4.6)

## 2013-03-31 LAB — POCT I-STAT 3, ART BLOOD GAS (G3+)
Acid-Base Excess: 3 mmol/L — ABNORMAL HIGH (ref 0.0–2.0)
BICARBONATE: 27.1 meq/L — AB (ref 20.0–24.0)
O2 Saturation: 97 %
PCO2 ART: 38.1 mmHg (ref 35.0–45.0)
PH ART: 7.46 — AB (ref 7.350–7.450)
TCO2: 28 mmol/L (ref 0–100)
pO2, Arterial: 85 mmHg (ref 80.0–100.0)

## 2013-03-31 LAB — CBC
HEMATOCRIT: 42.3 % (ref 36.0–46.0)
HEMOGLOBIN: 14.6 g/dL (ref 12.0–15.0)
MCH: 32.7 pg (ref 26.0–34.0)
MCHC: 34.5 g/dL (ref 30.0–36.0)
MCV: 94.6 fL (ref 78.0–100.0)
Platelets: 273 10*3/uL (ref 150–400)
RBC: 4.47 MIL/uL (ref 3.87–5.11)
RDW: 13.3 % (ref 11.5–15.5)
WBC: 22.8 10*3/uL — ABNORMAL HIGH (ref 4.0–10.5)

## 2013-03-31 LAB — MAGNESIUM
Magnesium: 2.5 mg/dL (ref 1.5–2.5)
Magnesium: 2.5 mg/dL (ref 1.5–2.5)

## 2013-03-31 MED ORDER — VITAL HIGH PROTEIN PO LIQD
1000.0000 mL | ORAL | Status: DC
  Administered 2013-03-31 – 2013-04-04 (×4): 1000 mL
  Filled 2013-03-31 (×7): qty 1000

## 2013-03-31 MED ORDER — PRO-STAT SUGAR FREE PO LIQD
30.0000 mL | Freq: Four times a day (QID) | ORAL | Status: DC
  Administered 2013-03-31 – 2013-04-04 (×18): 30 mL
  Filled 2013-03-31 (×22): qty 30

## 2013-03-31 MED ORDER — VECURONIUM BROMIDE 10 MG IV SOLR
8.0000 mg | Freq: Once | INTRAVENOUS | Status: AC
  Administered 2013-03-31: 8 mg via INTRAVENOUS

## 2013-03-31 MED ORDER — ALBUTEROL SULFATE (2.5 MG/3ML) 0.083% IN NEBU: 2.5000 mg | INHALATION_SOLUTION | RESPIRATORY_TRACT | Status: DC | PRN

## 2013-03-31 MED ORDER — VECURONIUM BROMIDE 10 MG IV SOLR
INTRAVENOUS | Status: AC
  Filled 2013-03-31: qty 10

## 2013-03-31 MED ORDER — IPRATROPIUM-ALBUTEROL 0.5-2.5 (3) MG/3ML IN SOLN
3.0000 mL | Freq: Four times a day (QID) | RESPIRATORY_TRACT | Status: DC
  Administered 2013-03-31 – 2013-04-05 (×21): 3 mL via RESPIRATORY_TRACT
  Filled 2013-03-31 (×21): qty 3

## 2013-03-31 MED ORDER — VITAL HIGH PROTEIN PO LIQD
1000.0000 mL | ORAL | Status: DC
  Filled 2013-03-31 (×2): qty 1000

## 2013-03-31 MED ORDER — INSULIN ASPART 100 UNIT/ML ~~LOC~~ SOLN
0.0000 [IU] | SUBCUTANEOUS | Status: DC
  Administered 2013-03-31 (×2): 2 [IU] via SUBCUTANEOUS
  Administered 2013-03-31: 3 [IU] via SUBCUTANEOUS
  Administered 2013-04-01 – 2013-04-04 (×15): 2 [IU] via SUBCUTANEOUS
  Administered 2013-04-04: 3 [IU] via SUBCUTANEOUS
  Administered 2013-04-04 – 2013-04-05 (×3): 2 [IU] via SUBCUTANEOUS

## 2013-03-31 MED ORDER — PRO-STAT SUGAR FREE PO LIQD
30.0000 mL | Freq: Two times a day (BID) | ORAL | Status: DC
  Filled 2013-03-31 (×2): qty 30

## 2013-03-31 NOTE — Progress Notes (Signed)
Video bronchoscopy procedure performed. Wang needle biopsy intervention performed. Brushing intervention performed. Bronchial washing intervention performed.  Patient seen and examined, agree with above note.  I dictated the care and orders written for this patient under my direction.  Rush Farmer, MD [phone]

## 2013-03-31 NOTE — Procedures (Signed)
Bronchoscopy Procedure Note Nicole Garrison [id number] January 27, 1955  Procedure: Bronchoscopy Indications: Diagnostic evaluation of the airways and Obtain specimens for culture and/or other diagnostic studies  Procedure Details Consent: Risks of procedure as well as the alternatives and risks of each were explained to the (patient/caregiver).  Consent for procedure obtained. Time Out: Verified patient identification, verified procedure, site/side was marked, verified correct patient position, special equipment/implants available, medications/allergies/relevent history reviewed, required imaging and test results available.  Performed  In preparation for procedure, patient was given 100% FiO2, bronchoscope lubricated and lidocaine given via ETT (5 ml). Sedation: Muscle relaxants and Propofol and Fentanyl  Airway entered and the following bronchi were examined: RUL, RML, RLL, LUL, LLL and Bronchi.   Procedures performed: Brushings performed x2 at the posterior and lateral segments of the RLL. Wang needle biopsies x3 at the main carina and right mainstem bronchus posteriorly. BAL x1 at the RML. Bronchoscope removed.    Evaluation Hemodynamic Status: BP stable throughout; O2 sats: stable throughout Patient's Current Condition: stable Specimens:  Sent serosanguinous fluid Complications: No apparent complications Patient did tolerate procedure well.   [name] 03/31/2013

## 2013-03-31 NOTE — Progress Notes (Signed)
INITIAL NUTRITION ASSESSMENT  DOCUMENTATION CODES Per approved criteria  -Morbid Obesity   INTERVENTION:  Utilize 74M PEPuP Protocol: initiate TF via OGT with Vital High Protein at 20 ml/h and Prostat 30 ml QID on day 1; on day 2, continue at goal rate of 20 ml/h (480 ml per day) with Prostat 30 ml QID to provide 880 kcals, 102 gm protein, 401 ml free water daily.  Above TF regimen plus current propofol will provide a total of 1651 kcals (~70% of estimated needs), 102 gm protein (94% of minimum protein needs), 401 ml free water daily.  NUTRITION DIAGNOSIS: Inadequate oral intake related to inability to eat as evidenced by NPO status.   Goal: Intake to meet >90% of estimated nutrition needs.  Monitor:  TF tolerance/adequacy, weight trend, labs, vent status.  Reason for Assessment: MD Consult for TF initiation and management.  58 y.o. female  Admitting Dx: Chest pain  ASSESSMENT: Patient is a 58 yo female smoker presented with abdominal pain x 1 month, constipation and chest pain. Found to have hypoxia and multiple pulmonary nodules. Admitted on 3/15.   Required transfer to ICU and intubation on 3/18. Received MD Consult for TF initiation and management. Per discussion with patient's family, patient was eating well prior to coming into the hospital. Nutrition focused physical exam completed.  No muscle or subcutaneous fat depletion noticed.  Patient is currently intubated on ventilator support.  MV: 14.7 L/min Temp (24hrs), Avg:99.4 F (37.4 C), Min:98.4 F (36.9 C), Max:100.6 F (38.1 C)  Propofol: 29.2 ml/hr providing 771 kcals/day.   Height: Ht Readings from Last 1 Encounters:  2013-04-26 5\' 4"  (1.626 m)    Weight: Wt Readings from Last 1 Encounters:  03/31/13 268 lb 4.8 oz (121.7 kg)    Ideal Body Weight: 54.5 kg  % Ideal Body Weight: 223%  Wt Readings from Last 10 Encounters:  03/31/13 268 lb 4.8 oz (121.7 kg)  03/31/13 268 lb 4.8 oz (121.7 kg)    Usual  Body Weight: unknown  % Usual Body Weight: N/A  BMI:  Body mass index is 46.03 kg/(m^2). class 3, extreme/morbid obesity  Estimated Nutritional Needs: Kcal: 2320 Protein: 109-136 gm Fluid: 2.3 L  Skin: no wounds  Diet Order: NPO  EDUCATION NEEDS: -Education not appropriate at this time   Intake/Output Summary (Last 24 hours) at 03/31/13 1156 Last data filed at 03/31/13 1100  Gross per 24 hour  Intake 1820.5 ml  Output   1250 ml  Net  570.5 ml    Last BM: 3/18   Labs:   Recent Labs Lab 04/26/2013 0740 03/28/13 0720 03/29/13 0420 03/30/13 0230 03/31/13 0400  NA 132* 134* 134* 137 136*  K 4.8 4.5 4.8 4.5 4.4  CL 91* 95* 93* 96 97  CO2 24 27 25 28 24   BUN 20 19 18 15  35*  CREATININE 0.85 0.58 0.63 0.50 1.04  CALCIUM 10.1 9.5 10.1 10.0 9.9  MG 2.2 2.1  --   --  2.5  PHOS  --  1.9*  --   --  1.9*  GLUCOSE 105* 121* 108* 110* 149*    CBG (last 3)   Recent Labs  03/30/13 2321 03/31/13 0334 03/31/13 0744  GLUCAP 140* 140* 137*    Scheduled Meds: . antiseptic oral rinse  15 mL Mouth Rinse QID  . azithromycin  500 mg Intravenous Q24H  . cefTRIAXone (ROCEPHIN)  IV  2 g Intravenous Q24H  . chlorhexidine  15 mL Mouth Rinse BID  .  enoxaparin (LOVENOX) injection  40 mg Subcutaneous Q24H  . feeding supplement (PRO-STAT SUGAR FREE 64)  30 mL Per Tube BID  . feeding supplement (VITAL HIGH PROTEIN)  1,000 mL Per Tube Q24H  . insulin aspart  0-15 Units Subcutaneous 6 times per day  . ipratropium-albuterol  3 mL Nebulization Q6H  . methylPREDNISolone (SOLU-MEDROL) injection  40 mg Intravenous Q12H  . pantoprazole (PROTONIX) IV  40 mg Intravenous Q24H    Continuous Infusions: . sodium chloride 50 mL/hr at 03/31/13 1048  . propofol 40 mcg/kg/min (03/31/13 1100)    Past Medical History  Diagnosis Date  . Chronic pain     back   . Borderline diabetes   . Diabetes mellitus without complication   . Hypertension   . Asthma     History reviewed. No pertinent  past surgical history.   Molli Barrows, RD, LDN, [place] Pager [phone] After Hours Pager [phone]

## 2013-03-31 NOTE — Procedures (Signed)
Bedside Bronchoscopy Procedure Note Nicole Garrison [id number] [birth year]  Procedure: Bronchoscopy Indications: Diagnostic evaluation of the airways and Obtain specimens for culture and/or other diagnostic studies  Procedure Details: ET Tube Size: 7.5 Bite block in place: No, paralytic used  In preparation for procedure, Patient hyper-oxygenated with 100 % FiO2 Airway entered and the following bronchi were examined: RUL, RML, RLL, LUL, LLL and Bronchi.   Bronchoscope removed.  , Patient placed back on 100% FiO2 at conclusion of procedure.    Evaluation BP 145/64  Pulse 75  Temp(Src) 98.4 F (36.9 C) (Oral)  Resp 30  Ht 5\' 4"  (1.626 m)  Wt 268 lb 4.8 oz (121.7 kg)  BMI 46.03 kg/m2  SpO2 95% Breath Sounds:Diminished O2 sats: stable throughout Patient's Current Condition: stable Specimens:  Wang needle biopsy, brushings, and BAL washing Complications: No apparent complications Patient did tolerate procedure well.   Sharyne Richters 03/31/2013, 3:25 PM

## 2013-03-31 NOTE — Progress Notes (Addendum)
PULMONARY / CRITICAL CARE MEDICINE   Name: Nicole Garrison MRN: [id number] DOB: 06/06/1955    ADMISSION DATE:  04/08/2013 CONSULTATION DATE:  04/11/2013  REFERRING MD :  Radford Pax  CHIEF COMPLAINT: Chest pain  BRIEF PATIENT DESCRIPTION:  58 yo female smoker presented with abdominal pain x 1 month, constipation and chest pain.  Found to have hypoxia and multiple pulmonary nodules.  PCCM asked to assume care.   SIGNIFICANT EVENTS: 3/15 Admit, cardiac cath 3/17 Endometrial Bx 3/18 VDRF, to ICU  STUDIES:  3/15 Cardiac cath >> normal coronaries and LV fx 3/15 CT chest >> innumerable diffuse pulmonary nodules with mediastinal/hilar LAN 3/15 Labs >> ESR 30, ACE 28, ds DNA < 1, Scl 70 < 1,  ANCA negative, anti CCP < 2 3/16 CT abd/pelvis >> Markedly thickened area of hypo attenuation within the central uterus measuring up to 4.7 cm thick.  LINES / TUBES: ETT 3/18>>> R IJ CVL 3/18>>>   CULTURES: Pneumococcal Ag 3/15 >> positive Legionella Ag 3/15 >> negative  RVP 3/15>>> neg   PATHOLOGY:  Uterine bx 3/17>>>grade 1 endometrial adenocarcinoma>>>  ANTIBIOTICS: Rocephin 3/15 >> Zithromax 3/15 >>  SUBJECTIVE:  Prelim path with grade 1 endometrial cancer.  Remains on 60% FiO2.   VITAL SIGNS: Temp:  [98.4 F (36.9 C)-100.6 F (38.1 C)] 98.4 F (36.9 C) (03/19 0852) Pulse Rate:  [54-136] 89 (03/19 0900) Resp:  [13-41] 27 (03/19 0900) BP: (97-167)/(44-84) 145/64 mmHg (03/19 0315) SpO2:  [89 %-100 %] 91 % (03/19 0900) Arterial Line BP: (109-170)/(52-85) 128/65 mmHg (03/19 0900) FiO2 (%):  [60 %-100 %] 60 % (03/19 0800) Weight:  [268 lb 4.8 oz (121.7 kg)] 268 lb 4.8 oz (121.7 kg) (03/19 0413) INTAKE / OUTPUT: Intake/Output     03/18 0701 - 03/19 0700 03/19 0701 - 03/20 0700   P.O.     I.V. (mL/kg) 1725.6 (14.2) 136.5 (1.1)   IV Piggyback 100    Total Intake(mL/kg) 1825.6 (15) 136.5 (1.1)   Urine (mL/kg/hr) 1050 (0.4) 100 (0.3)   Total Output 1050 100   Net +775.6 +36.5           PHYSICAL EXAMINATION: General: sedated, NAD Neuro:  Sedated, RASS -1  HEENT: mm moist, ETT, no JVD Cardiovascular: s1s2 regular, intermittent tachy Lungs: decreased breath sound, b/l exp wheeze, prolonged exhalation Abdomen: soft, non tender, mild distention, + bowel sounds Musculoskeletal: no edema Skin: no rashes  LABS: CBC Recent Labs     03/29/13  0420  03/30/13  0230  03/31/13  0400  WBC  28.6*  25.6*  22.8*  HGB  17.4*  15.9*  14.6  HCT  50.6*  45.7  42.3  PLT  279  269  273    Coag's No results found for this basename: APTT, INR,  in the last 72 hours  BMET Recent Labs     03/29/13  0420  03/30/13  0230  03/31/13  0400  NA  134*  137  136*  K  4.8  4.5  4.4  CL  93*  96  97  CO2  $Re'25  28  24  'OVi$ BUN  18  15  35*  CREATININE  0.63  0.50  1.04  GLUCOSE  108*  110*  149*    Electrolytes Recent Labs     03/29/13  0420  03/30/13  0230  03/31/13  0400  CALCIUM  10.1  10.0  9.9    Sepsis Markers No results found for this basename: LACTICACIDVEN, PROCALCITON, O2SATVEN,  in the last 72 hours  ABG Recent Labs     03/30/13  1140  03/30/13  1305  PHART  7.167*  7.335*  PCO2ART  88.2*  53.0*  PO2ART  150.0*  71.0*    Liver Enzymes Recent Labs     03/29/13  0420  AST  30  ALT  13  ALKPHOS  135*  BILITOT  1.0  ALBUMIN  2.8*    Cardiac Enzymes No results found for this basename: TROPONINI, PROBNP,  in the last 72 hours Imaging Dg Chest Port 1 View  03/31/2013   CLINICAL DATA:  Acute respiratory failure.  On ventilator.  EXAM: PORTABLE CHEST - 1 VIEW  COMPARISON:  03/30/2013  FINDINGS: Diffuse bilateral miliary nodular disease is again seen throughout both lungs, without significant change. Bibasilar atelectasis is stable. Cardiomegaly is stable. Support lines and tubes remain in appropriate position. No pneumothorax identified.  IMPRESSION: No significant change compared with prior exam.   Electronically Signed   By: Earle Gell M.D.   On:  03/31/2013 07:24   Dg Chest Portable 1 View  03/30/2013   CLINICAL DATA:  For Central line and endotracheal tube placement. Diabetes and hypertension. Asthma. Biopsy of abnormally thickened endometrium on 03/19/2013.  EXAM: PORTABLE CHEST - 1 VIEW  COMPARISON:  DG CHEST 1V PORT dated 03/28/2013; CT ANGIO CHEST W/CM &/OR WO/CM dated 04/10/2013; CT ABD/PELVIS W CM dated 03/28/2013  FINDINGS: Right IJ line tip: Upper SVC. Endotracheal tube tip 4 cm above the carina. Nasogastric tube enters the stomach.  Continued miliary pulmonary nodules and airspace opacities and confluent nodules in the lung bases.  IMPRESSION: 1. Tubes and lines appear satisfactorily positioned. No pneumothorax or complicating feature. 2. Extensive miliary nodular lung disease with confluent airspace opacities in the lung bases, similar to prior.   Electronically Signed   By: Sherryl Barters M.D.   On: 03/30/2013 12:12    ASSESSMENT / PLAN:  PULMONARY:  Acute hypoxic respiratory failure 2nd to multiple pulmonary nodules >> likely metastatic lesions.  Grade 1 endometrial cancer. Per GYN-Onc would be highly unusual for such widespread pulmonary mets, but per husband pt has had concerning s/s x 2 years and has refused to seek care.  She is active smoker.   ?underlying COPD P: Oxygen to keep SpO2 > 92% Gyn following - unusual presentation for metastatic gyn malignancy - ?sarcoma  Need to consider FOB for bx pulm nodules - ?mets v other malignancy   Bx bronch vs VATS, regardless would favor improved vent needs prior to this Consider bal and brush for cytology prior to above Vent support - 8cc/kg  F/u CXR  ABG BD's  changed pred to solumedral  CARDIOLOGY:  Chest pain >> cardiac cath normal. Tachycardia  Hx HTN P: PRN pain meds  RENAL:  Hyponatremia - mild ARF, r/o ATN  P:  F/u chem  Keep pos balance No lasix Re assess cvp  ENDOCRINE:  Diet controlled DM. P:  SSI   GASTROINTESTINAL  No active issue  P:  Start  TF 3/19 - hold if plans for FOB/bx PPI  INFECTIOUS:  Urine pneumococcal Ag positive. P: Cont abx as above  Cultures neg to date  Consider bronch with lavage depending on Bx chosen, awiat improvd O2 needs  HEMATOLOGY: A: Polycythemia. P: F/u CBC lovenox in setting cancer, crt rising, may ned to change to sub q hep  NEURO:  P:  Propofol - goal RASS -1  Daily Osvaldo Shipper, NP 03/31/2013  9:28 AM Pager: [phone] or [phone]  *Care during the described time interval was provided by me and/or other providers on the critical care team. I have reviewed this patient's available data, including medical history, events of note, physical examination and test results as part of my evaluation.   Ccm time30 min   Lavon Paganini. Titus Mould, MD, [place] Pgr: [facility]

## 2013-04-01 ENCOUNTER — Inpatient Hospital Stay (HOSPITAL_COMMUNITY): Payer: 59

## 2013-04-01 LAB — BASIC METABOLIC PANEL
BUN: 41 mg/dL — ABNORMAL HIGH (ref 6–23)
CALCIUM: 10.5 mg/dL (ref 8.4–10.5)
CO2: 26 mEq/L (ref 19–32)
Chloride: 99 mEq/L (ref 96–112)
Creatinine, Ser: 0.78 mg/dL (ref 0.50–1.10)
GFR calc Af Amer: >90 mL/min (ref >90)
GFR calc non Af Amer: >90 mL/min (ref >90)
Glucose, Bld: 147 mg/dL — ABNORMAL HIGH (ref 70–99)
Potassium: 4.5 mEq/L (ref 3.7–5.3)
SODIUM: 140 meq/L (ref 137–147)

## 2013-04-01 LAB — GLUCOSE, CAPILLARY
GLUCOSE-CAPILLARY: 121 mg/dL — AB (ref 70–99)
Glucose-Capillary: 120 mg/dL — ABNORMAL HIGH (ref 70–99)
Glucose-Capillary: 122 mg/dL — ABNORMAL HIGH (ref 70–99)
Glucose-Capillary: 128 mg/dL — ABNORMAL HIGH (ref 70–99)
Glucose-Capillary: 141 mg/dL — ABNORMAL HIGH (ref 70–99)
Glucose-Capillary: 142 mg/dL — ABNORMAL HIGH (ref 70–99)

## 2013-04-01 LAB — CBC
HCT: 43.8 % (ref 36.0–46.0)
Hemoglobin: 14.8 g/dL (ref 12.0–15.0)
MCH: 32.1 pg (ref 26.0–34.0)
MCHC: 33.8 g/dL (ref 30.0–36.0)
MCV: 95 fL (ref 78.0–100.0)
PLATELETS: 296 10*3/uL (ref 150–400)
RBC: 4.61 MIL/uL (ref 3.87–5.11)
RDW: 13.5 % (ref 11.5–15.5)
WBC: 30.8 10*3/uL — ABNORMAL HIGH (ref 4.0–10.5)

## 2013-04-01 LAB — PHOSPHORUS
Phosphorus: 2.6 mg/dL (ref 2.3–4.6)
Phosphorus: 3 mg/dL (ref 2.3–4.6)

## 2013-04-01 LAB — MAGNESIUM
MAGNESIUM: 2.5 mg/dL (ref 1.5–2.5)
Magnesium: 2.4 mg/dL (ref 1.5–2.5)

## 2013-04-01 MED ORDER — FUROSEMIDE 10 MG/ML IJ SOLN
20.0000 mg | Freq: Two times a day (BID) | INTRAMUSCULAR | Status: DC
  Administered 2013-04-01 (×2): 20 mg via INTRAVENOUS
  Filled 2013-04-01 (×3): qty 2

## 2013-04-01 NOTE — Research (Signed)
Protocol Title: The MIND-[place] Study Modifying the Impact of ICU-Associated Neurological Dysfunction: A multicenter, doubleblind, randomized, placebo-controlled trial investigating the effects of intravenously (IV) administered typical and atypical antipsychotics (haloperidol and ziprasidone) on delirium in critically ill patients.   This patient, Nicole Garrison, has been consented to the above clinical trial according to FDA regulations, GCP guidelines and [place] SOPs. The study design has been explained to the surrogate (spouse, Falicity Sheets) and the surrogate demonstrated comprehension. No study procedures have been initiated before consenting of this patient/surrogate. This surrogate was given sufficient time for reading the consent and asking questions. All risks, benefits and options have been thoroughly discussed. This patient/surrogate was not coerced in any way to participate or to continue participation in this clinical trial. The surrogate has signed voluntarily at 9:30am on April 01, 2013. This surrogate was given a copy of this consent.   While enrolled in the study, no open label antipsychotic medications can be administered to the patient. The patient will be accessed for up to 96 hours from the time of enrollment.  Doreatha Martin, RN

## 2013-04-01 NOTE — Progress Notes (Signed)
Placed pt back on full support due to vomiting tube feed.  Pt very anxious RR 30's.

## 2013-04-01 NOTE — Progress Notes (Addendum)
PULMONARY / CRITICAL CARE MEDICINE   Name: Troyce Febo MRN: [id number] DOB: March 20, 1955    ADMISSION DATE:  04/09/2013 CONSULTATION DATE:  03/20/2013  REFERRING MD :  Radford Pax  CHIEF COMPLAINT: Chest pain  BRIEF PATIENT DESCRIPTION:  58 yo female smoker presented with abdominal pain x 1 month, constipation and chest pain.  Found to have hypoxia and multiple pulmonary nodules.  PCCM asked to assume care.   SIGNIFICANT EVENTS: 3/15 Admit, cardiac cath 3/17 Endometrial Bx 3/18 VDRF, to ICU 3/19 bronch brush  STUDIES:  3/15 Cardiac cath >> normal coronaries and LV fx 3/15 CT chest >> innumerable diffuse pulmonary nodules with mediastinal/hilar LAN 3/15 Labs >> ESR 30, ACE 28, ds DNA < 1, Scl 70 < 1,  ANCA negative, anti CCP < 2 3/16 CT abd/pelvis >> Markedly thickened area of hypo attenuation within the central uterus measuring up to 4.7 cm thick. 3/19 bronch brush, no endobronchial lesions  LINES / TUBES: ETT 3/18>>> R IJ CVL 3/18>>>  A linr rt rad 3/18>>>  CULTURES: Pneumococcal Ag 3/15 >> positive Legionella Ag 3/15 >> negative  RVP 3/15>>> neg  Bronch 3/19>>>  Afb>>  Fungal>>>  PATHOLOGY:  Uterine bx 3/17>>>grade 1 endometrial adenocarcinoma>>> 3/19 bronch brush>>>  ANTIBIOTICS: Rocephin 3/15 >> Zithromax 3/15 >>3/20  SUBJECTIVE:  Bronch done  VITAL SIGNS: Temp:  [97.6 F (36.4 C)-98.7 F (37.1 C)] 97.6 F (36.4 C) (03/20 0000) Pulse Rate:  [57-98] 62 (03/20 0700) Resp:  [22-30] 30 (03/20 0700) BP: (98-138)/(59-81) 98/81 mmHg (03/20 0318) SpO2:  [90 %-100 %] 93 % (03/20 0716) Arterial Line BP: (100-171)/(57-131) 114/81 mmHg (03/20 0700) FiO2 (%):  [60 %-80 %] 60 % (03/20 0716) Weight:  [123.2 kg (271 lb 9.7 oz)] 123.2 kg (271 lb 9.7 oz) (03/20 0500) INTAKE / OUTPUT: Intake/Output     03/19 0701 - 03/20 0700 03/20 0701 - 03/21 0700   I.V. (mL/kg) 1728.9 (14)    NG/GT 320    IV Piggyback 550    Total Intake(mL/kg) 2598.9 (21.1)    Urine (mL/kg/hr) 1100  (0.4)    Total Output 1100     Net +1498.9            PHYSICAL EXAMINATION: General: sedated Neuro:  Sedated, RASS -1, followed commands HEENT: mm moist, ETT, no JVD Cardiovascular: s1s2 regular, intermittent tachy Lungs: decreased breath sound, mild wheezing Abdomen: soft, non tender, mild distention, + bowel sounds Musculoskeletal: no edema Skin: no rashes  LABS: CBC Recent Labs     03/30/13  0230  03/31/13  0400  04/01/13  0420  WBC  25.6*  22.8*  30.8*  HGB  15.9*  14.6  14.8  HCT  45.7  42.3  43.8  PLT  269  273  296    Coag's No results found for this basename: APTT, INR,  in the last 72 hours  BMET Recent Labs     03/30/13  0230  03/31/13  0400  04/01/13  0420  NA  137  136*  140  K  4.5  4.4  4.5  CL  96  97  99  CO2  $Re'28  24  26  'cdY$ BUN  15  35*  41*  CREATININE  0.50  1.04  0.78  GLUCOSE  110*  149*  147*    Electrolytes Recent Labs     03/30/13  0230  03/31/13  0400  03/31/13  2130  04/01/13  0420  CALCIUM  10.0  9.9   --  10.5  MG   --   2.5  2.5   --   PHOS   --   1.9*  2.1*   --     Sepsis Markers No results found for this basename: LACTICACIDVEN, PROCALCITON, O2SATVEN,  in the last 72 hours  ABG Recent Labs     03/30/13  1140  03/30/13  1305  03/31/13  1015  PHART  7.167*  7.335*  7.460*  PCO2ART  88.2*  53.0*  38.1  PO2ART  150.0*  71.0*  85.0    Liver Enzymes No results found for this basename: AST, ALT, ALKPHOS, BILITOT, ALBUMIN,  in the last 72 hours  Cardiac Enzymes No results found for this basename: TROPONINI, PROBNP,  in the last 72 hours Imaging Dg Chest Portable 1 View  04/01/2013   CLINICAL DATA:  Respiratory failure  EXAM: PORTABLE CHEST - 1 VIEW  COMPARISON:  DG CHEST 1V PORT dated 03/31/2013; DG CHEST 1V PORT dated 03/30/2013; DG CHEST 1V PORT dated 03/28/2013; DG CHEST 1V PORT dated 03/18/2013; CT ANGIO CHEST W/CM &/OR WO/CM dated 03/23/2013; CT ABD/PELVIS W CM dated 03/28/2013  FINDINGS: Grossly unchanged cardiac  silhouette and mediastinal contours. Stable positioning of support apparatus. No pneumothorax. Innumerable pulmonary nodules are again demonstrated bilaterally. Grossly unchanged bibasilar heterogeneous/consolidative opacities. Trace bilateral effusions are not excluded. Unchanged bones.  IMPRESSION: 1.  Stable positioning of support apparatus.  No pneumothorax. 2. Unchanged innumerable bilateral pulmonary nodules again worrisome for metastatic disease, with additional differential considerations including multifocal atypical infection and sarcoidosis. 3. Grossly unchanged bibasilar opacities, left greater than right, atelectasis versus infiltrate.   Electronically Signed   By: Sandi Mariscal M.D.   On: 04/01/2013 07:13   Dg Chest Port 1 View  03/31/2013   CLINICAL DATA:  Acute respiratory failure.  On ventilator.  EXAM: PORTABLE CHEST - 1 VIEW  COMPARISON:  03/30/2013  FINDINGS: Diffuse bilateral miliary nodular disease is again seen throughout both lungs, without significant change. Bibasilar atelectasis is stable. Cardiomegaly is stable. Support lines and tubes remain in appropriate position. No pneumothorax identified.  IMPRESSION: No significant change compared with prior exam.   Electronically Signed   By: Earle Gell M.D.   On: 03/31/2013 07:24   Dg Chest Portable 1 View  03/30/2013   CLINICAL DATA:  For Central line and endotracheal tube placement. Diabetes and hypertension. Asthma. Biopsy of abnormally thickened endometrium on 03/19/2013.  EXAM: PORTABLE CHEST - 1 VIEW  COMPARISON:  DG CHEST 1V PORT dated 03/28/2013; CT ANGIO CHEST W/CM &/OR WO/CM dated 03/26/2013; CT ABD/PELVIS W CM dated 03/28/2013  FINDINGS: Right IJ line tip: Upper SVC. Endotracheal tube tip 4 cm above the carina. Nasogastric tube enters the stomach.  Continued miliary pulmonary nodules and airspace opacities and confluent nodules in the lung bases.  IMPRESSION: 1. Tubes and lines appear satisfactorily positioned. No pneumothorax or  complicating feature. 2. Extensive miliary nodular lung disease with confluent airspace opacities in the lung bases, similar to prior.   Electronically Signed   By: Sherryl Barters M.D.   On: 03/30/2013 12:12    ASSESSMENT / PLAN:  PULMONARY:  Acute hypoxic respiratory failure 2nd to multiple pulmonary nodules >> likely metastatic lesions.   (Grade 1 endometrial cancer. Per GYN-Onc would be highly unusual for such widespread pulmonary mets, but per husband pt has had concerning s/s x 2 years and has refused to seek care.  She is active smoker) ?underlying COPD P: Oxygen to keep SpO2 > 92% If cytology  neg from brush, consider bronch Bx vs VATS Vent support - 8cc/kg, if unable to 50% then peep to 8 Consider SBT on 60% , ps 10, cpap 5 BD's  Maintain solumedral pcxr in am  Add ards as protocol Rate reduction today  CARDIOLOGY:  Chest pain >> cardiac cath normal. Tachycardia  Hx HTN P: Tele Dc a line  RENAL:  ARF, r/o ATN  BUN from steroids likely P:  F/u chem  Keep even to neg balance cvp 13, lasix as still on 60%  ENDOCRINE:  Diet controlled DM. 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 P: F/u CBC again ain am  lovenox in setting cancer, crt in am   NEURO:  P:  Propofol - goal RASS -1  Daily wua  Avoid bezno Chair position  Husband updated Global: follw bronch, wean if able, lasix  Ccm time30 min   Lavon Paganini. Titus Mould, MD, [place] Pgr: [facility]

## 2013-04-02 ENCOUNTER — Inpatient Hospital Stay (HOSPITAL_COMMUNITY): Payer: 59

## 2013-04-02 ENCOUNTER — Encounter (HOSPITAL_COMMUNITY): Payer: Self-pay | Admitting: Critical Care Medicine

## 2013-04-02 DIAGNOSIS — C541 Malignant neoplasm of endometrium: Secondary | ICD-10-CM

## 2013-04-02 DIAGNOSIS — C7800 Secondary malignant neoplasm of unspecified lung: Principal | ICD-10-CM | POA: Diagnosis present

## 2013-04-02 DIAGNOSIS — C549 Malignant neoplasm of corpus uteri, unspecified: Secondary | ICD-10-CM

## 2013-04-02 HISTORY — DX: Malignant neoplasm of endometrium: C54.1

## 2013-04-02 LAB — POCT I-STAT 3, ART BLOOD GAS (G3+)
Acid-Base Excess: 7 mmol/L — ABNORMAL HIGH (ref 0.0–2.0)
Bicarbonate: 32 mEq/L — ABNORMAL HIGH (ref 20.0–24.0)
O2 Saturation: 97 %
PCO2 ART: 45.6 mmHg — AB (ref 35.0–45.0)
PO2 ART: 83 mmHg (ref 80.0–100.0)
Patient temperature: 98.42
TCO2: 33 mmol/L (ref 0–100)
pH, Arterial: 7.453 — ABNORMAL HIGH (ref 7.350–7.450)

## 2013-04-02 LAB — CBC WITH DIFFERENTIAL/PLATELET
Basophils Absolute: 0.1 10*3/uL (ref 0.0–0.1)
Basophils Relative: 0 % (ref 0–1)
Eosinophils Absolute: 0 10*3/uL (ref 0.0–0.7)
Eosinophils Relative: 0 % (ref 0–5)
HCT: 44.4 % (ref 36.0–46.0)
HEMOGLOBIN: 14.7 g/dL (ref 12.0–15.0)
Lymphocytes Relative: 2 % — ABNORMAL LOW (ref 12–46)
Lymphs Abs: 0.8 10*3/uL (ref 0.7–4.0)
MCH: 32.2 pg (ref 26.0–34.0)
MCHC: 33.1 g/dL (ref 30.0–36.0)
MCV: 97.2 fL (ref 78.0–100.0)
MONOS PCT: 5 % (ref 3–12)
Monocytes Absolute: 1.8 10*3/uL — ABNORMAL HIGH (ref 0.1–1.0)
NEUTROS ABS: 34.1 10*3/uL — AB (ref 1.7–7.7)
Neutrophils Relative %: 93 % — ABNORMAL HIGH (ref 43–77)
Platelets: 289 10*3/uL (ref 150–400)
RBC: 4.57 MIL/uL (ref 3.87–5.11)
RDW: 13.7 % (ref 11.5–15.5)
WBC: 36.7 10*3/uL — AB (ref 4.0–10.5)

## 2013-04-02 LAB — CULTURE, BLOOD (ROUTINE X 2)
CULTURE: NO GROWTH
CULTURE: NO GROWTH

## 2013-04-02 LAB — COMPREHENSIVE METABOLIC PANEL
ALT: 11 U/L (ref 0–35)
AST: 19 U/L (ref 0–37)
Albumin: 2.4 g/dL — ABNORMAL LOW (ref 3.5–5.2)
Alkaline Phosphatase: 127 U/L — ABNORMAL HIGH (ref 39–117)
BUN: 52 mg/dL — ABNORMAL HIGH (ref 6–23)
CO2: 28 meq/L (ref 19–32)
Calcium: 10.6 mg/dL — ABNORMAL HIGH (ref 8.4–10.5)
Chloride: 97 mEq/L (ref 96–112)
Creatinine, Ser: 0.72 mg/dL (ref 0.50–1.10)
GLUCOSE: 151 mg/dL — AB (ref 70–99)
Potassium: 4.8 mEq/L (ref 3.7–5.3)
Sodium: 138 mEq/L (ref 137–147)
Total Bilirubin: 0.3 mg/dL (ref 0.3–1.2)
Total Protein: 6.6 g/dL (ref 6.0–8.3)

## 2013-04-02 LAB — GLUCOSE, CAPILLARY
GLUCOSE-CAPILLARY: 134 mg/dL — AB (ref 70–99)
Glucose-Capillary: 135 mg/dL — ABNORMAL HIGH (ref 70–99)

## 2013-04-02 LAB — PHOSPHORUS
PHOSPHORUS: 2.6 mg/dL (ref 2.3–4.6)
Phosphorus: 3.3 mg/dL (ref 2.3–4.6)

## 2013-04-02 LAB — MAGNESIUM
MAGNESIUM: 2.4 mg/dL (ref 1.5–2.5)
Magnesium: 2.4 mg/dL (ref 1.5–2.5)

## 2013-04-02 MED ORDER — FUROSEMIDE 10 MG/ML IJ SOLN
40.0000 mg | Freq: Three times a day (TID) | INTRAMUSCULAR | Status: AC
  Administered 2013-04-02 (×2): 40 mg via INTRAVENOUS
  Filled 2013-04-02 (×2): qty 4

## 2013-04-02 MED ORDER — SODIUM CHLORIDE 0.9 % IV SOLN
25.0000 ug/h | INTRAVENOUS | Status: DC
  Administered 2013-04-02: 50 ug/h via INTRAVENOUS
  Administered 2013-04-03 – 2013-04-05 (×2): 100 ug/h via INTRAVENOUS
  Filled 2013-04-02 (×3): qty 50

## 2013-04-02 NOTE — Progress Notes (Signed)
PULMONARY / CRITICAL CARE MEDICINE   Name: Nicole Garrison MRN: [id number] DOB: 1955-03-04    ADMISSION DATE:  04/12/2013 CONSULTATION DATE:  04/07/2013  REFERRING MD :  Radford Pax  CHIEF COMPLAINT: Chest pain  BRIEF PATIENT DESCRIPTION:  58 yo female smoker presented with abdominal pain x 1 month, constipation and chest pain.  Found to have hypoxia and multiple pulmonary nodules.  PCCM asked to assume care.   SIGNIFICANT EVENTS: 3/15 Admit, cardiac cath 3/17 Endometrial Bx 3/18 VDRF, to ICU 3/19 bronch brush  STUDIES:  3/15 Cardiac cath >> normal coronaries and LV fx 3/15 CT chest >> innumerable diffuse pulmonary nodules with mediastinal/hilar LAN 3/15 Labs >> ESR 30, ACE 28, ds DNA < 1, Scl 70 < 1,  ANCA negative, anti CCP < 2 3/16 CT abd/pelvis >> Markedly thickened area of hypo attenuation within the central uterus measuring up to 4.7 cm thick. 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  ANTIBIOTICS: Rocephin 3/15 >> Zithromax 3/15 >>3/20  SUBJECTIVE:   worseing infiltrates and oxygenation  VITAL SIGNS: Temp:  [98 F (36.7 C)-99 F (37.2 C)] 98.6 F (37 C) (03/21 0427) Pulse Rate:  [29-131] 74 (03/21 0700) Resp:  [15-30] 19 (03/21 0700) BP: (117-187)/(51-99) 147/67 mmHg (03/21 0700) SpO2:  [92 %-100 %] 93 % (03/21 0700) Arterial Line BP: (100-143)/(84-115) 100/94 mmHg (03/20 1300) FiO2 (%):  [40 %-60 %] 50 % (03/21 0400) Weight:  [121.1 kg (266 lb 15.6 oz)] 121.1 kg (266 lb 15.6 oz) (03/21 0116) INTAKE / OUTPUT: Intake/Output     03/20 0701 - 03/21 0700 03/21 0701 - 03/22 0700   I.V. (mL/kg) 1786.4 (14.8)    NG/GT 480    IV Piggyback 50     Total Intake(mL/kg) 2316.4 (19.1)    Urine (mL/kg/hr) 2825 (1)    Total Output 2825     Net -508.6            PHYSICAL EXAMINATION: General: sedated Neuro:  Sedated, RASS -1, followed commands HEENT: mm moist, ETT, no JVD Cardiovascular: s1s2 regular, intermittent tachy Lungs: decreased breath sound, mild wheezing Abdomen: soft, non tender, mild distention, + bowel sounds Musculoskeletal: no edema Skin: no rashes  LABS: CBC Recent Labs     03/31/13  0400  04/01/13  0420  04/02/13  0455  WBC  22.8*  30.8*  36.7*  HGB  14.6  14.8  14.7  HCT  42.3  43.8  44.4  PLT  273  296  289    Coag's No results found for this basename: APTT, INR,  in the last 72 hours  BMET Recent Labs     03/31/13  0400  04/01/13  0420  04/02/13  0455  NA  136*  140  138  K  4.4  4.5  4.8  CL  97  99  97  CO2  $Re'24  26  28  'kRV$ BUN  35*  41*  52*  CREATININE  1.04  0.78  0.72  GLUCOSE  149*  147*  151*    Electrolytes Recent Labs     03/31/13  0400  03/31/13  2130  04/01/13  0420  04/01/13  2125  04/02/13  0455  CALCIUM  9.9   --   10.5   --   10.6*  MG  2.5  2.5  2.5  2.4   --   PHOS  1.9*  2.1*  2.6  3.0   --     Sepsis Markers No results found for this basename: LACTICACIDVEN, PROCALCITON, O2SATVEN,  in the last 72 hours  ABG Recent Labs     03/30/13  1140  03/30/13  1305  03/31/13  1015  PHART  7.167*  7.335*  7.460*  PCO2ART  88.2*  53.0*  38.1  PO2ART  150.0*  71.0*  85.0    Liver Enzymes Recent Labs     04/02/13  0455  AST  19  ALT  11  ALKPHOS  127*  BILITOT  0.3  ALBUMIN  2.4*    Cardiac Enzymes No results found for this basename: TROPONINI, PROBNP,  in the last 72 hours Imaging Dg Chest Portable 1 View  04/01/2013   CLINICAL DATA:  Respiratory failure  EXAM: PORTABLE CHEST - 1 VIEW  COMPARISON:  DG CHEST 1V PORT dated 03/31/2013; DG CHEST 1V PORT dated 03/30/2013; DG CHEST 1V PORT dated 03/28/2013; DG CHEST 1V PORT dated 04/02/2013; CT ANGIO CHEST W/CM  &/OR WO/CM dated 03/22/2013; CT ABD/PELVIS W CM dated 03/28/2013  FINDINGS: Grossly unchanged cardiac silhouette and mediastinal contours. Stable positioning of support apparatus. No pneumothorax. Innumerable pulmonary nodules are again demonstrated bilaterally. Grossly unchanged bibasilar heterogeneous/consolidative opacities. Trace bilateral effusions are not excluded. Unchanged bones.  IMPRESSION: 1.  Stable positioning of support apparatus.  No pneumothorax. 2. Unchanged innumerable bilateral pulmonary nodules again worrisome for metastatic disease, with additional differential considerations including multifocal atypical infection and sarcoidosis. 3. Grossly unchanged bibasilar opacities, left greater than right, atelectasis versus infiltrate.   Electronically Signed   By: Sandi Mariscal M.D.   On: 04/01/2013 07:13    ASSESSMENT / PLAN:  PULMONARY:  Acute hypoxic respiratory failure 2nd to multiple pulmonary nodules >metastatic lesions to lung per fob pos (Grade 1 endometrial cancer. Per GYN-Onc)   ?underlying COPD P: Oxygen to keep SpO2 > 92% Increase Peep to 10 Vent support - 8cc/kg, if unable to 50% then peep to 8 BD's  Maintain solumedral pcxr in am   CARDIOLOGY:  Chest pain >> cardiac cath normal. Tachycardia  Hx HTN P: Tele   RENAL:  ARF, r/o ATN  BUN from steroids likely I>>O+ P:  F/u chem  Keep even to neg balance Diuresis   ENDOCRINE:  Diet controlled DM. 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 P: F/u CBC again ain am  lovenox in setting cancer, crt in am   NEURO:  P:  Propofol - goal RASS -1  Daily wua  Avoid bezno Chair position  Global: need Onc input, lasix , increase PEEP  Ccm time40 min   Glyn Ade  [phone]  Cell  [phone]  If no response or cell goes  to voicemail, call beeper [place]

## 2013-04-03 ENCOUNTER — Inpatient Hospital Stay (HOSPITAL_COMMUNITY): Payer: 59

## 2013-04-03 LAB — CULTURE, BAL-QUANTITATIVE: Colony Count: NO GROWTH

## 2013-04-03 LAB — GLUCOSE, CAPILLARY
GLUCOSE-CAPILLARY: 144 mg/dL — AB (ref 70–99)
GLUCOSE-CAPILLARY: 127 mg/dL — AB (ref 70–99)
Glucose-Capillary: 129 mg/dL — ABNORMAL HIGH (ref 70–99)
Glucose-Capillary: 140 mg/dL — ABNORMAL HIGH (ref 70–99)
Glucose-Capillary: 112 mg/dL — ABNORMAL HIGH (ref 70–99)
Glucose-Capillary: 144 mg/dL — ABNORMAL HIGH (ref 70–99)
Glucose-Capillary: 130 mg/dL — ABNORMAL HIGH (ref 70–99)

## 2013-04-03 LAB — COMPREHENSIVE METABOLIC PANEL
ALBUMIN: 2.6 g/dL — AB (ref 3.5–5.2)
ALK PHOS: 147 U/L — AB (ref 39–117)
ALT: 10 U/L (ref 0–35)
AST: 18 U/L (ref 0–37)
BILIRUBIN TOTAL: 0.4 mg/dL (ref 0.3–1.2)
BUN: 59 mg/dL — AB (ref 6–23)
CHLORIDE: 97 meq/L (ref 96–112)
CO2: 32 mEq/L (ref 19–32)
Calcium: 10.8 mg/dL — ABNORMAL HIGH (ref 8.4–10.5)
Creatinine, Ser: 0.77 mg/dL (ref 0.50–1.10)
GFR calc non Af Amer: >90 mL/min (ref >90)
Glucose, Bld: 144 mg/dL — ABNORMAL HIGH (ref 70–99)
POTASSIUM: 4.9 meq/L (ref 3.7–5.3)
SODIUM: 142 meq/L (ref 137–147)
Total Protein: 6.7 g/dL (ref 6.0–8.3)

## 2013-04-03 LAB — CULTURE, BAL-QUANTITATIVE W GRAM STAIN: Culture: NO GROWTH

## 2013-04-03 MED ORDER — PANTOPRAZOLE SODIUM 40 MG PO PACK
40.0000 mg | PACK | Freq: Every day | ORAL | Status: DC
  Administered 2013-04-03 – 2013-04-04 (×2): 40 mg
  Filled 2013-04-03 (×3): qty 20

## 2013-04-03 MED ORDER — FUROSEMIDE 10 MG/ML IJ SOLN
40.0000 mg | Freq: Three times a day (TID) | INTRAMUSCULAR | Status: AC
  Administered 2013-04-03 (×2): 40 mg via INTRAVENOUS
  Filled 2013-04-03 (×2): qty 4

## 2013-04-03 NOTE — Progress Notes (Signed)
PULMONARY / CRITICAL CARE MEDICINE   Name: Nicole Garrison MRN: [id number] DOB: 1955/03/31    ADMISSION DATE:  03/25/2013 CONSULTATION DATE:  03/30/2013  REFERRING MD :  Radford Pax  CHIEF COMPLAINT: Chest pain  BRIEF PATIENT DESCRIPTION:  58 yo female smoker presented with abdominal pain x 1 month, constipation and chest pain.  Found to have hypoxia and multiple pulmonary nodules.  PCCM asked to assume care.   SIGNIFICANT EVENTS: 3/15 Admit, cardiac cath 3/17 Endometrial Bx 3/18 VDRF, to ICU 3/19 bronch brush  STUDIES:  3/15 Cardiac cath >> normal coronaries and LV fx 3/15 CT chest >> innumerable diffuse pulmonary nodules with mediastinal/hilar LAN 3/15 Labs >> ESR 30, ACE 28, ds DNA < 1, Scl 70 < 1,  ANCA negative, anti CCP < 2 3/16 CT abd/pelvis >> Markedly thickened area of hypo attenuation within the central uterus measuring up to 4.7 cm thick. 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 (mL/kg) 2147.5 (17.9)    NG/GT 660    IV Piggyback 50    Total Intake(mL/kg) 2857.5 (23.8)    Urine (mL/kg/hr) 3885  (1.3)    Total Output 3885     Net -1027.5            PHYSICAL EXAMINATION: General: sedated on propofol/fent  Neuro:  Sedated, RASS -1, followed commands with Wua HEENT: mm moist, ETT, no JVD Cardiovascular: s1s2 regular, intermittent tachy Lungs: decreased breath sound, mild wheezing Abdomen: soft, non tender, mild distention, + bowel sounds Musculoskeletal: no edema Skin: no rashes  LABS: CBC Recent Labs     04/01/13  0420  04/02/13  0455  WBC  30.8*  36.7*  HGB  14.8  14.7  HCT  43.8  44.4  PLT  296  289    Coag's No results found for this basename: APTT, INR,  in the last 72 hours  BMET Recent Labs     04/01/13  0420  04/02/13  0455  04/03/13  0340  NA  140  138  142  K  4.5  4.8  4.9  CL  99  97  97  CO2  26  28  32  BUN  41*  52*  59*  CREATININE  0.78  0.72  0.77  GLUCOSE  147*  151*  144*    Electrolytes Recent Labs     04/01/13  0420  04/01/13  2125  04/02/13  0455  04/02/13  0930  04/02/13  2126  04/03/13  0340  CALCIUM  10.5   --  10.6*   --    --   10.8*  MG  2.5  2.4   --   2.4  2.4   --   PHOS  2.6  3.0   --   2.6  3.3   --     Sepsis Markers No results found for this basename: LACTICACIDVEN, PROCALCITON, O2SATVEN,  in the last 72 hours  ABG Recent Labs     04/02/13  1638  PHART  7.453*  PCO2ART  45.6*  PO2ART  83.0    Liver Enzymes Recent Labs     04/02/13  0455  04/03/13  0340  AST  19  18  ALT  11  10  ALKPHOS  127*  147*  BILITOT  0.3  0.4  ALBUMIN  2.4*  2.6*    Cardiac Enzymes No results found for this basename: TROPONINI, PROBNP,  in the last 72 hours Imaging Dg Chest Port 1 View  04/03/2013   CLINICAL DATA:  Pulmonary infiltrates  EXAM: PORTABLE CHEST - 1 VIEW  COMPARISON:  04/02/2013  FINDINGS: Endotracheal tube terminates 4 cm above the carina.  Diffuse bilateral pulmonary nodules, coalescent in the bilateral lower lobes, unchanged. Possible small left pleural effusion. No pneumothorax.  The heart is  normal in size.  Right IJ venous catheter terminates in the mid SVC. Enteric tube courses below the diaphragm.  IMPRESSION: Endotracheal tube terminates 4 cm above the carina.  Stable diffuse bilateral pulmonary nodules/opacities.  Possible small left pleural effusion.   Electronically Signed   By: Julian Hy M.D.   On: 04/03/2013 09:52   Dg Chest Port 1 View  04/02/2013   CLINICAL DATA:  Endotracheal tube position.  EXAM: PORTABLE CHEST - 1 VIEW  COMPARISON:  April 01, 2013.  FINDINGS: Endotracheal tube is seen in grossly good position with distal tip 3.5 cm above the carina. Mild cardiomegaly is noted. Nasogastric tube is seen passing through the esophagus into the stomach. Stable innumerable pulmonary nodules are noted bilaterally concerning for atypical infection, chronic inflammation or possibly metastatic disease. No pneumothorax is noted. Mild bilateral pleural effusions are present.  IMPRESSION: No significant change in bilateral lung nodules and opacities compared to prior exam.   Electronically Signed   By: Sabino Dick M.D.   On: 04/02/2013 08:17    ASSESSMENT / PLAN:  PULMONARY:  Acute hypoxic respiratory failure 2nd to multiple pulmonary nodules >metastatic lesions to lung per fob pos (Grade 1 endometrial cancer. Per GYN-Onc) ?underlying COPD P: Oxygen to keep SpO2 > 92% Cont Ards protocol but had to increase Vt to 8cc/kg   CARDIOLOGY:  Chest pain >> cardiac cath normal. Tachycardia  Hx HTN P: Tele   RENAL:  ARF, r/o ATN  BUN from steroids likely I>>O+ P:  F/u chem  Keep even to neg balance Diuresis   ENDOCRINE:  Diet controlled DM. P:  SSI   GASTROINTESTINAL  Tolerating TF P:  Cont TF PPI to oral  INFECTIOUS:  Urine pneumococcal Ag positive R/o PNA Likely all met dz Progressive leukocytosis  P: Maintain ceftriaxone,   HEMATOLOGY: A: Polycythemia. P: F/u CBC again ain am  lovenox in setting cancer   NEURO:  P:  Propofol - goal RASS -1   Daily wua  Avoid bezno Chair position  Global: need Onc input, lasix , cont lasix Ccm time 30 min   Glyn Ade  [phone]  Cell  [phone]  If no response or cell goes to voicemail, call beeper [phone]  [facility]

## 2013-04-03 NOTE — Progress Notes (Signed)
RT discovered that pt. Tidal volume had been changed to pt. 6cc's. RT neither charge RT was not informed of change. RT notified MD in box and MD Deterding stated that its in pt. Notes to remain on 8cc's. Pt. To remain on 8cc's for now.

## 2013-04-04 LAB — GLUCOSE, CAPILLARY
GLUCOSE-CAPILLARY: 116 mg/dL — AB (ref 70–99)
Glucose-Capillary: 153 mg/dL — ABNORMAL HIGH (ref 70–99)
Glucose-Capillary: 136 mg/dL — ABNORMAL HIGH (ref 70–99)
Glucose-Capillary: 144 mg/dL — ABNORMAL HIGH (ref 70–99)
Glucose-Capillary: 115 mg/dL — ABNORMAL HIGH (ref 70–99)

## 2013-04-04 LAB — POCT I-STAT 3, ART BLOOD GAS (G3+)
ACID-BASE EXCESS: 10 mmol/L — AB (ref 0.0–2.0)
Bicarbonate: 34.3 mEq/L — ABNORMAL HIGH (ref 20.0–24.0)
O2 Saturation: 93 %
TCO2: 36 mmol/L (ref 0–100)
pCO2 arterial: 43.9 mmHg (ref 35.0–45.0)
pH, Arterial: 7.501 — ABNORMAL HIGH (ref 7.350–7.450)
pO2, Arterial: 62 mmHg — ABNORMAL LOW (ref 80.0–100.0)

## 2013-04-04 LAB — TRIGLYCERIDES: Triglycerides: 238 mg/dL — ABNORMAL HIGH (ref <150)

## 2013-04-04 NOTE — Care Management Note (Signed)
    Page 1 of 1   04/04/2013     12:06:40 PM   CARE MANAGEMENT NOTE 04/04/2013  Patient:  Nicole Garrison, Nicole Garrison   Account Number:  [id number]  Date Initiated:  04/04/2013  Documentation initiated by:  Elissa Hefty  Subjective/Objective Assessment:   adm w resp failure  on vent     Action/Plan:   lives w husband   Anticipated DC Date:     Anticipated DC Plan:           Choice offered to / List presented to:             Status of service:   Medicare Important Message given?   (If response is "NO", the following Medicare IM given date fields will be blank) Date Medicare IM given:   Date Additional Medicare IM given:    Discharge Disposition:    Per UR Regulation:  Reviewed for med. necessity/level of care/duration of stay  If discussed at [place] of Stay Meetings, dates discussed:   2013-04-24    Comments:

## 2013-04-04 NOTE — Progress Notes (Signed)
Follow up Note: Gyn-Onc   Nicole Garrison 58 y.o. female  CC:  Chief Complaint   Patient presents with   .  Code STEMI    HPI:  Patient intubated and sedated.  Husband and mother in law at bedside.  History obtained from medical records. Dr. Kennon Rounds asked Dr. Alycia Rossetti to see the patient on 03/30/13 after an endometrial biopsy revealed endometrial cancer. Verbal from pathology on that day was that of a grade 1 endometrioid adenocarcinoma pending stains.   58 yo obese (257 lbs) female , 1 ppd smoker age 51 till 1 week ago, who has had abdominal/back pain/hematuria x 1-3 month and has been on narcotics with bed rest due to x 7 days with recent constipation. She awoke 03/25/13 2200 with acute mid sternal chest pain thought to be cardiac in origin. Left heart cath 3/15 was negative and chest x ray revealed miliary pattern, she developed increasing hypoxia refractory to O2. Of note she was started on narcotics for chronic abd.back pain 1 week ago. Due to negative cardiac cath and extensive radiographic radiographic changes on c x r And hypoxemia CT angio chest was done - PE negative but showed diffuse bilateral nodular densities suggestive of malignancy. Currently intubated on a propofol drip.  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  Further classification hindered but the absence of a cell block for ancillary studies.  FINDINGS:  There are innumerable pulmonary nodules throughout both lungs, representative dominant right middle lobe pulmonary nodule measuring 1.4 cm. Trace pleural fluid or thickening noted. There is extensive dependent compressive atelectasis. The thyroid is normal in appearance where visualized. Great vessels are normal in caliber. Mild atheromatous aortic calcification without aneurysm. Allowing for mild bolus inhomogeneity due to timing, there is no filling defect up to and involving the third order pulmonary arteries to suggest acute pulmonary embolism.  Subsegmental emboli may be obscured by bolus inhomogeneity particularly at the lung bases. Heart size is at upper limits of normal. Bilateral hilar lymphadenopathy is noted, 1 cm on the left, 1.4 cm on the right. Sub carinal confluent lymphadenopathy measures 2.0 cm. Pretracheal nodal conglomerate measures 1.2 cm . No axillary lymphadenopathy. Central airways are patent. No pericardial effusion. Incomplete imaging of the upper abdomen demonstrates incomplete visualization of the adrenal glands but no mass identified. Multilevel disc degenerative change noted in the spine. Schmorl's node formation is noted at multiple levels. No lytic or sclerotic osseous lesion is identified.  Review of the MIP images confirms the above findings.  IMPRESSION:  Innumerable diffuse pulmonary nodules with mediastinal and hilar lymphadenopathy. Differential consideration includes hematogenous spread of metastatic disease from possible abdominal or pelvic primary neoplasm, miliary tuberculosis, other infection such as fungal infection, and sarcoid. If further evaluation for possible underlying malignancy is indicated, CT abdomen/pelvis with contrast would be recommended for further evaluation. Allowing for mild inhomogeneity of the contrast bolus predominantly at the lung bases, there is no CT evidence for acute pulmonary embolism up to and including the third order pulmonary arteries.  CT ABDOMEN AND PELVIS WITH CONTRAST  TECHNIQUE:  Multidetector CT imaging of the abdomen and pelvis was performed using the standard protocol following bolus administration of intravenous contrast. Sagittal and coronal MPR images reconstructed from axial data set.  CONTRAST: 132mL OMNIPAQUE IOHEXOL 300 MG/ML SOLN. Dilute oral contrast.  COMPARISON: CT chest 03/15/2013  FINDINGS:  Numerous nodules at bilateral lung bases. Bibasilar atelectasis and minimal right pleural effusion. Liver, spleen, pancreas, kidneys, and adrenal glands normal  appearance.  Enlarged uterus with large area of central low attenuation up to 4.7 cm thick, tumor not excluded. Area of low attenuation extends to the right posterolateral uterine margin. No definite extra uterine extension identified. Unremarkable bladder, ureters, adnexae, and appendix. Normal appearing stomach and bowel loops. Single upper normal size portacaval lymph node 10 mm short axis. No additional mass, adenopathy, free fluid or inflammatory process. Osseous structures unremarkable.  IMPRESSION:  Numerous bibasilar pulmonary nodules, please refer to discussion in preceeding CT chest exam. Markedly thickened area of hypo attenuation within the central uterus measuring up to 4.7 cm thick. Patient's menstrual status is unknown but this represents an abnormal thickened endometrial complex regardless of whether patient is pre or post menopausal. Tissue diagnosis recommended to exclude endometrial malignancy.   Current Meds:  . antiseptic oral rinse  15 mL Mouth Rinse QID  . chlorhexidine  15 mL Mouth Rinse BID  . feeding supplement (PRO-STAT SUGAR FREE 64)  30 mL Per Tube QID  . feeding supplement (VITAL HIGH PROTEIN)  1,000 mL Per Tube Q24H  . insulin aspart  0-15 Units Subcutaneous 6 times per day  . ipratropium-albuterol  3 mL Nebulization Q6H  . pantoprazole sodium  40 mg Per Tube Daily    Allergy:  Allergies   Allergen  Reactions   .  Benadryl [Diphenhydramine Hcl (Sleep)]  Hypertension   .  Codeine  Hives and Other (See Comments)     Headaches    Social Hx:  History    Social History   .  Marital Status:  Married     Spouse Name:  N/A     Number of Children:  N/A   .  Years of Education:  N/A    Occupational History   .  Not on file.    Social History Main Topics   .  Smoking status:  Former Research scientist (life sciences)   .  Smokeless tobacco:  Not on file   .  Alcohol Use:  No   .  Drug Use:  No   .  Sexual Activity:  Not on file    Other Topics  Concern   .  Not on file    Social  History Narrative   .  No narrative on file    Past Surgical Hx: History reviewed. No pertinent past surgical history.   Past Medical Hx:  Past Medical History   Diagnosis  Date   .  Chronic pain      back   .  Borderline diabetes    .  Diabetes mellitus without complication    .  Hypertension    .  Asthma     Oncology Hx:   No history exists.    Family Hx: No family history on file.   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  Bronchial brushings from 03/31/13 resulting non-small cell carcinoma.  Given her current status, treatment would not be recommended per Dr. Nancy Marus.  Dr. Alycia Rossetti spoke with Dr. Alva Garnet today via phone with the recommendation for withdraw of care per Critical Care/Pulmonology.  I have met with her husband and family, discussed final pathology, and reviewed Dr. Elenora Gamma recommendations.  The husband states he is to speak with Dr. Alva Garnet in the am to discuss withdrawal of care.  No  questions or concerns voiced at this time.  The family is advised to contact the office for any needs.     Dr Elenora Gamma pager: [phone]  Joylene John, NP for Dr. Nancy Marus Gynecologic Oncology [phone]

## 2013-04-04 NOTE — Progress Notes (Signed)
PULMONARY / CRITICAL CARE MEDICINE   Name: Nicole Garrison MRN: [id number] DOB: 04-05-1955    ADMISSION DATE:  04/01/2013 CONSULTATION DATE:  03/16/2013  REFERRING MD :  Radford Pax  CHIEF COMPLAINT: Chest pain  BRIEF PATIENT DESCRIPTION:  58 yo female smoker presented with abdominal pain x 1 month, constipation and chest pain.  Found to have hypoxia and multiple pulmonary nodules.  PCCM asked to assume care.   SIGNIFICANT EVENTS: 3/15 Admit, cardiac cath 3/17 Endometrial Bx 3/18 VDRF, to ICU 3/19 FOB with cytology brushings, washing  STUDIES:  3/15 Cardiac cath >> normal coronaries and LV fx 3/15 CT chest:  innumerable diffuse pulmonary nodules with mediastinal/hilar LAN 3/15 Labs >> ESR 30, ACE 28, ds DNA < 1, Scl 70 < 1,  ANCA negative, anti CCP < 2 3/16 CT abd/pelvis >> Markedly thickened area of hypo attenuation within the central uterus measuring up to 4.7 cm thick. 3/17 Surg path, endometrial bx: INVASIVE ADENOCARCINOMA, ENDOMETRIOID TYPE 3/19 Bronch specimen cytologies: Malignant cells are present consistent with a non-small cell carcinoma.  LINES / TUBES: ETT 3/18 >>  R IJ CVL 3/18 >>   R radial A-line 3/18 >>   CULTURES: Pneumococcal Ag 3/15 >> positive Legionella Ag 3/15 >> negative  RVP 3/15>>> neg  Bronch 3/19>> NEG   ANTIBIOTICS: Rocephin 3/15 >> 3/22 Zithromax 3/15 >>3/20  SUBJECTIVE:  RASS -3 on propofol. Not F/C  VITAL SIGNS: Temp:  [97.7 F (36.5 C)-98.8 F (37.1 C)] 98.8 F (37.1 C) (03/23 1225) Pulse Rate:  [55-78] 70 (03/23 1200) Resp:  [30-35] 35 (03/23 1200) BP: (115-150)/(54-96) 143/61 mmHg (03/23 1200) SpO2:  [88 %-100 %] 97 % (03/23 1200) FiO2 (%):  [40 %-50 %] 40 % (03/23 1226) Weight:  [118.7 kg (261 lb 11 oz)] 118.7 kg (261 lb 11 oz) (03/23 0400) INTAKE / OUTPUT: Intake/Output     03/22 0701 - 03/23 0700 03/23 0701 - 03/24 0700   I.V. (mL/kg) 2220.2 (18.7) 398.7 (3.4)   NG/GT 650 120   IV Piggyback     Total Intake(mL/kg) 2870.2  (24.2) 518.7 (4.4)   Urine (mL/kg/hr) 3425 (1.2) 450 (0.6)   Total Output 3425 450   Net -554.8 +68.7          PHYSICAL EXAMINATION: General: sedated on propofol/fent  Neuro:  Sedated, RASS -3 HEENT: mm moist, ETT, no JVD Cardiovascular: s1s2 regular, intermittent tachy Lungs: decreased breath sound, mild wheezing Abdomen: soft, non tender, mild distention, + bowel sounds Musculoskeletal: no edema Skin: no rashes  LABS:  I have reviewed all of today's lab results. Relevant abnormalities are discussed in the A/P section  CXR: NNF  ASSESSMENT / PLAN:  PULMONARY:  Acute hypoxic respiratory failure Widespread pulmonary metastases (Grade 1 endometrial cancer. Per GYN-Onc) ?underlying COPD P: Cont full vent support Not weanable Not a candidate for trach Need to address goals of care  CARDIOLOGY:  Chest pain, resolved Tachycardia  Hx HTN P: Cont montitoring  RENAL:  ARF, resolved  Edema P:  Monitor BMET intermittently Monitor I/Os Correct electrolytes as indicated  ENDOCRINE:  DM2 P:  Cont SSI  DC systemic steroids  GASTROINTESTINAL  No issues P:  SUP; enteral PPI Cont TF  INFECTIOUS:  Urine pneumococcal Ag positive Doubt active PNA Likely all met dz Leukocytosis on systemic steroids P: Micro and abx as above Monitor fever curve and WBC count  HEMATOLOGY/ONC: A: No issues P: DVT px: LMWH Monitor CBC intermittently Transfuse per usual ICU guidelines Awaiting input from [place] re:  treatment options  NEURO:  P:  Cont sedation protocol Daily wua   30 mins CCM time   Merton Border, MD ; [facility] [phone].  After 5:30 PM or weekends, call [phone]

## 2013-04-05 ENCOUNTER — Inpatient Hospital Stay (HOSPITAL_COMMUNITY): Payer: 59

## 2013-04-05 DIAGNOSIS — Z515 Encounter for palliative care: Secondary | ICD-10-CM

## 2013-04-05 LAB — COMPREHENSIVE METABOLIC PANEL
ALBUMIN: 2.6 g/dL — AB (ref 3.5–5.2)
ALK PHOS: 156 U/L — AB (ref 39–117)
ALT: 11 U/L (ref 0–35)
AST: 24 U/L (ref 0–37)
BUN: 70 mg/dL — AB (ref 6–23)
CALCIUM: 11.2 mg/dL — AB (ref 8.4–10.5)
CO2: 34 mEq/L — ABNORMAL HIGH (ref 19–32)
CREATININE: 0.66 mg/dL (ref 0.50–1.10)
Chloride: 99 mEq/L (ref 96–112)
GFR calc non Af Amer: >90 mL/min (ref >90)
GLUCOSE: 116 mg/dL — AB (ref 70–99)
POTASSIUM: 4.9 meq/L (ref 3.7–5.3)
Sodium: 145 mEq/L (ref 137–147)
TOTAL PROTEIN: 6.8 g/dL (ref 6.0–8.3)
Total Bilirubin: 0.5 mg/dL (ref 0.3–1.2)

## 2013-04-05 LAB — GLUCOSE, CAPILLARY
GLUCOSE-CAPILLARY: 121 mg/dL — AB (ref 70–99)
GLUCOSE-CAPILLARY: 126 mg/dL — AB (ref 70–99)
Glucose-Capillary: 110 mg/dL — ABNORMAL HIGH (ref 70–99)

## 2013-04-05 LAB — CBC
HEMATOCRIT: 44 % (ref 36.0–46.0)
HEMOGLOBIN: 14.5 g/dL (ref 12.0–15.0)
MCH: 32.6 pg (ref 26.0–34.0)
MCHC: 33 g/dL (ref 30.0–36.0)
MCV: 98.9 fL (ref 78.0–100.0)
Platelets: 310 10*3/uL (ref 150–400)
RBC: 4.45 MIL/uL (ref 3.87–5.11)
RDW: 14 % (ref 11.5–15.5)
WBC: 43 10*3/uL — ABNORMAL HIGH (ref 4.0–10.5)

## 2013-04-05 MED ORDER — FENTANYL CITRATE 0.05 MG/ML IJ SOLN: 100.0000 ug | INTRAMUSCULAR | Status: DC | PRN

## 2013-04-13 NOTE — Procedures (Signed)
Extubation Procedure Note  Patient Details:   Name: Nicole Garrison DOB: 06-18-1955 MRN: [id number]   Airway Documentation:     Evaluation  O2 sats: transiently fell during during procedure Complications: No apparent complications Patient did tolerate procedure well. Bilateral Breath Sounds: Clear Suctioning: Airway No Pt terminally extubated to RA per MD order. No complications noted. RT will monitor.   Tamala Julian Arlene 03/30/2013, 10:46 AM

## 2013-04-13 NOTE — Progress Notes (Signed)
Chaplain responded to page from nursing.  Chaplain went down and met with family and offered prayer.  There seems to be a significant amount of family support and appropriate level of grieving.  Chaplain let family know that spiritual support is available should they desire further care in the future.  Family seemed grateful.  Chaplain will remain available should a need or request arise. °

## 2013-04-13 NOTE — Progress Notes (Signed)
50 ml of fentanyl wasted in sink witnessed by Ines Bloomer RN. Tiney Rouge RN

## 2013-04-13 NOTE — Progress Notes (Signed)
Time of death:1315. Family at bedside. Physician notified. No breath sounds,no heart sounds auscultated. Pupils not reactive. Two nurse check-Ceaira Ernster RN and Carma Leaven. Funeral information received from husband.Emotional support provided to husband and family. Tiney Rouge RN

## 2013-04-13 NOTE — Progress Notes (Signed)
Family wishes to proceed with ful comfort and extubation 15 min conversation with husband and mother in law   Merton Border, MD ; [facility] service Mobile [phone].  After 5:30 PM or weekends, call [phone]

## 2013-04-13 NOTE — Discharge Summary (Signed)
PULMONARY / CRITICAL CARE MEDICINE   Name: Nicole Garrison MRN: [id number] DOB: 1955/11/25    DEATH SUMMARY  DATE OF ADMISSION:  March 31, 2022  DATE OF DISCHARGE/DEATH:  04/08/13  ADMISSION DIAGNOSES:   Chest pain Dyspnea New finding of LBBB Chronic back pain Borderline DM Former Smoker   DISCHARGE DIAGNOSES:   New diagnosis of endometrial cancer Widespread pulmonary metastases Chest pain Dyspnea New finding of LBBB Chronic back pain Borderline DM Former Smoker Acute hypoxic resp failure Suspected underlying COPD Sinus tachycardia History of hypertension Acute renal insuff Hypervolemia/edema DM 2 Urine pneumococcal Ag positive  Leukocytosis on systemic steroids   PRESENTATION:   Pt was admitted with the following HPI and the above admission diagnoses:  58 y/o woman with history of chronic back pain and borderline DM, who developed acute chest pain at approximately 2200 yesterday evening. She describes the pain as a severe pressure that awoke her from sleep. She endorses accompanying dyspnea and nausea. He pain was initially 10/10 in intensity but has subsequently decreased to 4/10 with fentanyl 100 mcg and NTG paste. The patient denies a prior history of CAD. She notes that both of her parents have CAD. She recently quit smoking 1 week ago after a 20 year history of tobacco abuse. She is compliant with her medications. He has intermittent hematuria with "pink" urine that is being worked up. Pt received ASA 324 mg x 1 by EMS and heparin 4,000 units IV x 1 in ED.   HOSPITAL COURSE:   03/31/2022 Admitted to Cardiology Service with CP and new finding of LBBB. Urgent cardiac cath: normal coronary arteries. Normal LV function Mar 31, 2022 CXR performed revealing highly abnormal appearance of the lungs with diffuse innumerable nodular opacities throughout both lungs  31-Mar-2022 CT chest:  innumerable diffuse pulmonary nodules with mediastinal/hilar LAN 03/31/2022 Hypoxemia noted. PCCM consult requested 31-Mar-2022  Labs >> ESR 30, ACE 28, ds DNA < 1, Scl 70 < 1,  ANCA negative, anti CCP < 2 3/16 CT abd/pelvis:  Markedly thickened area of hypo attenuation within the central uterus measuring up to 4.7 cm thick. 3/17 Endometrial Bx: INVASIVE ADENOCARCINOMA, ENDOMETRIOID TYPE 3/18 Worsening hypoxemia and respiratory distress. Transferred to ICU and PCCM service. Intubated 3/19 FOB with cytology brushings, washing: Malignant cells present consistent with a non-small cell carcinoma. 3/20 - 3/23 Vent dependence. Required high level of vent support and heavy sedation to maintain ventilator synchrony 3/23 Husband updated by both PCCM and Gyn/Onc re: limited options of management and low likelihood of surviving to discharge. Pt made DNR April 09, 2022 Family @ bedside. Husband expressed desire for extubation and full comfort care. Pt extubated and died peacefully on fentanyl gtt. No autopsy planned  LINES / TUBES: ETT 3/18 >> 04/09/2022 R IJ CVL 3/18 R radial A-line 3/18  CULTURES: Pneumococcal Ag 03-31-2022 >> positive Legionella Ag 2022/03/31 >> negative  RVP 3/15>>> neg  Bronch 3/19>> NEG  ANTIBIOTICS: Rocephin March 31, 2022 >> 3/22 Zithromax 31-Mar-2022 >>3/20    Cause of death:  Metastatic endometrial cancer  Contributing factors: Widespread pulmonary metastases Acute hypoxic resp failure  Autopsy: No  Smoking: No   Merton Border, MD;  PCCM service

## 2013-04-13 DEATH — deceased

## 2013-04-29 LAB — FUNGUS CULTURE W SMEAR: Fungal Smear: NONE SEEN

## 2013-05-13 LAB — AFB CULTURE WITH SMEAR (NOT AT ARMC): ACID FAST SMEAR: NONE SEEN

## 2013-12-22 ENCOUNTER — Encounter (HOSPITAL_COMMUNITY): Payer: Self-pay | Admitting: Cardiovascular Disease

## 2014-12-09 IMAGING — CT CT ANGIO CHEST
2 of 9 series · 18 of 46 positions shown · IV contrast (APPLIED)
Comparison: DG CHEST 1V PORT dated 03/27/2013

CLINICAL DATA: Right-sided chest pain, shortness of breath

EXAM:
CT ANGIOGRAPHY CHEST WITH CONTRAST
TECHNIQUE: Multidetector CT imaging of the chest was performed using the
standard protocol during bolus administration of intravenous
contrast. Multiplanar CT image reconstructions and MIPs were
obtained to evaluate the vascular anatomy.
CONTRAST:  65mL OMNIPAQUE IOHEXOL 350 MG/ML SOLN

[Series 6: thins · axial · 0.68mm/px · z∈[-663,-419]mm · 15 of 276 slices shown]
[im 16/276  lung]
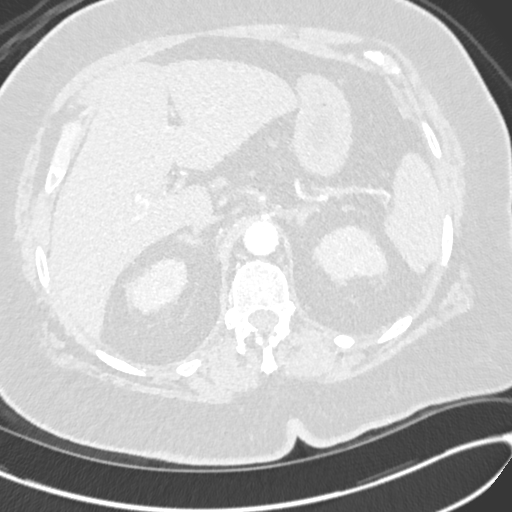
[im 31/276  soft-tissue]
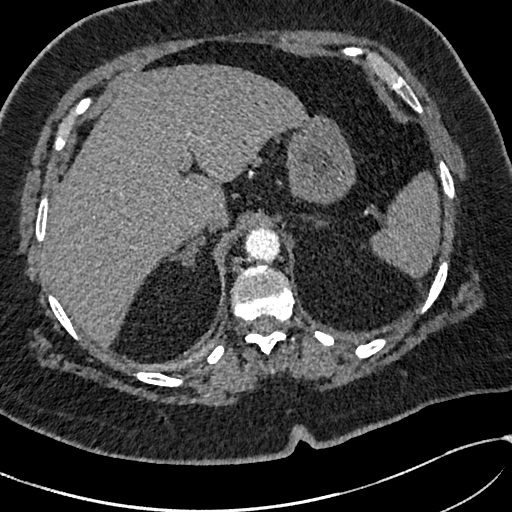
[im 46/276  lung]
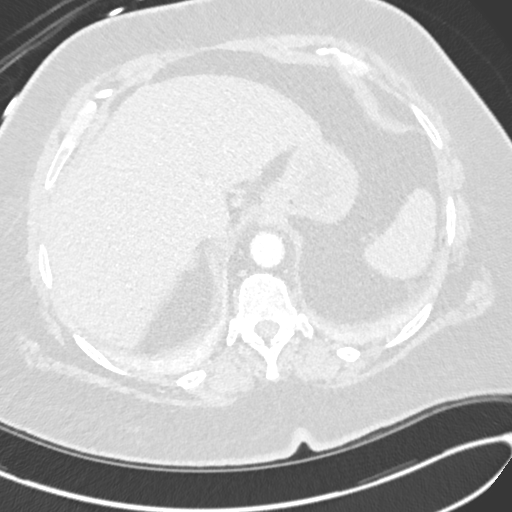
[im 62/276  soft-tissue]
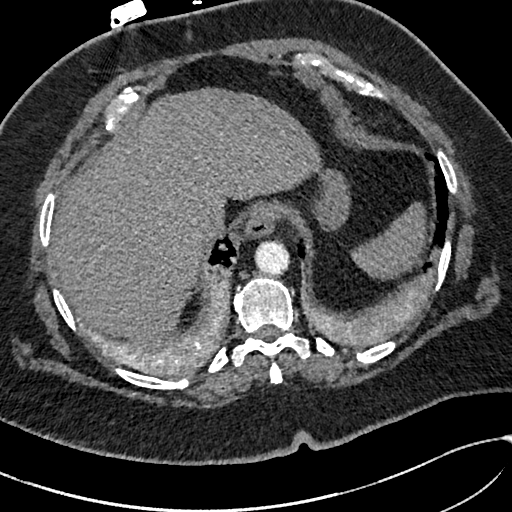
[im 92/276  lung]
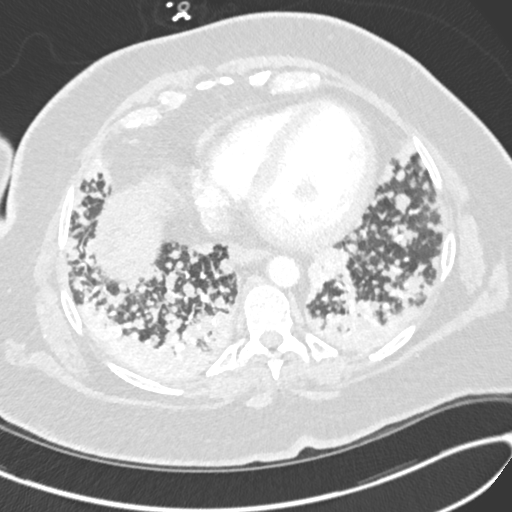
[im 107/276  soft-tissue]
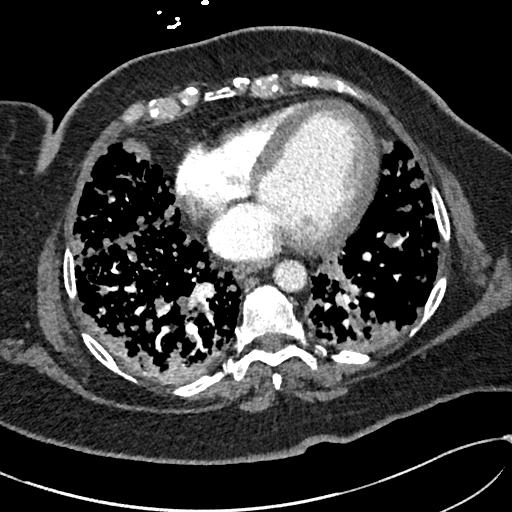
[im 123/276  lung]
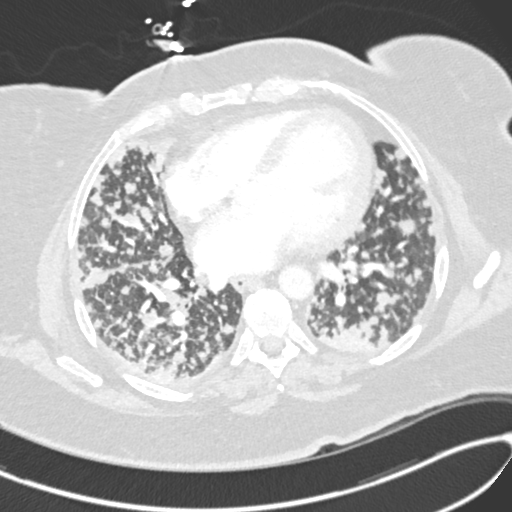
[im 138/276  soft-tissue]
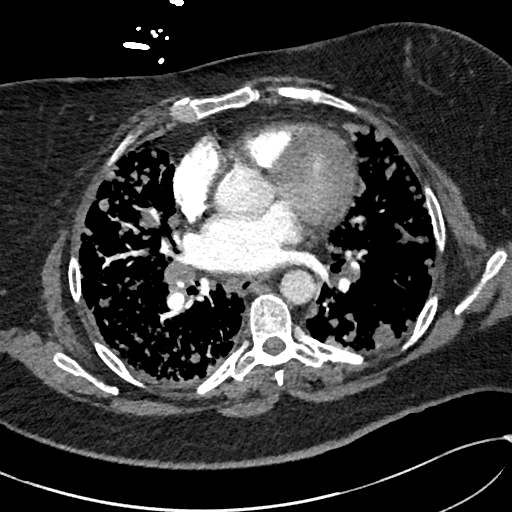
[im 153/276  lung]
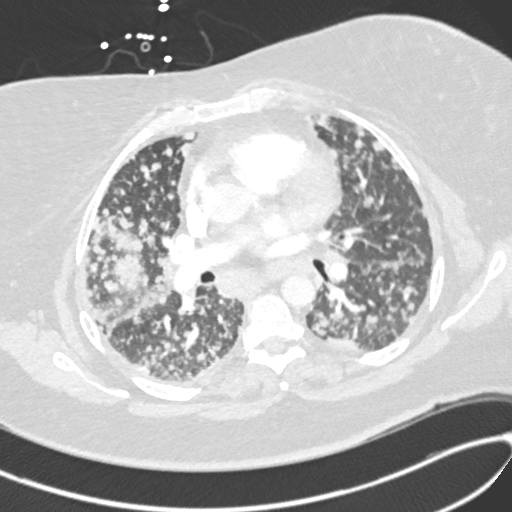
[im 169/276  soft-tissue]
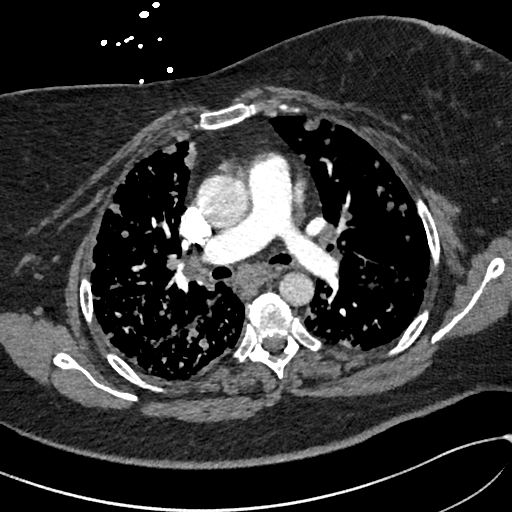
[im 184/276  lung]
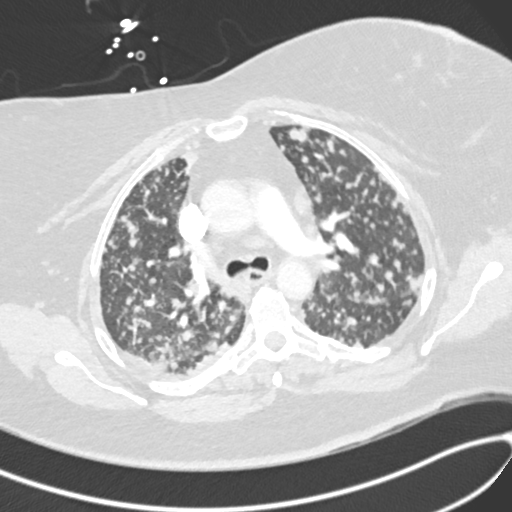
[im 214/276  soft-tissue]
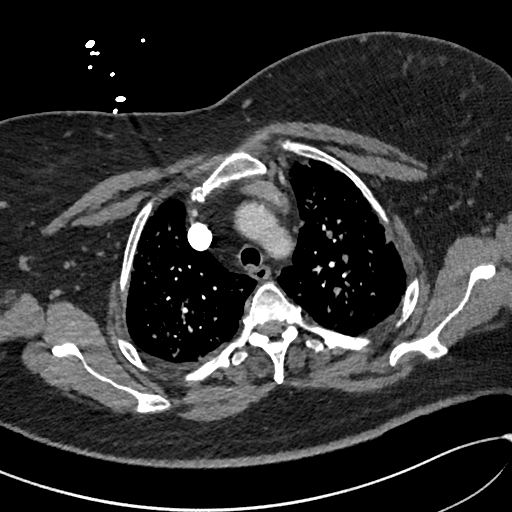
[im 230/276  lung]
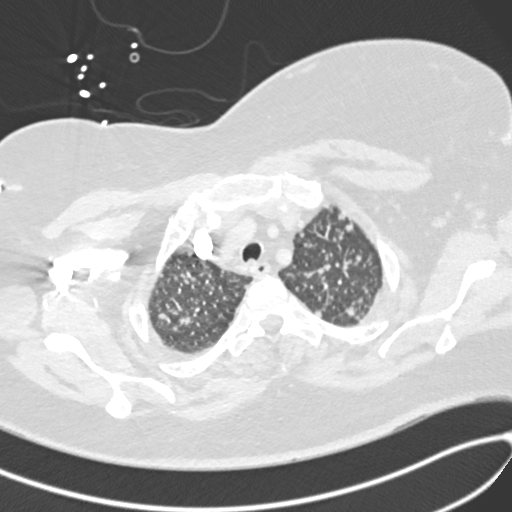
[im 245/276  soft-tissue]
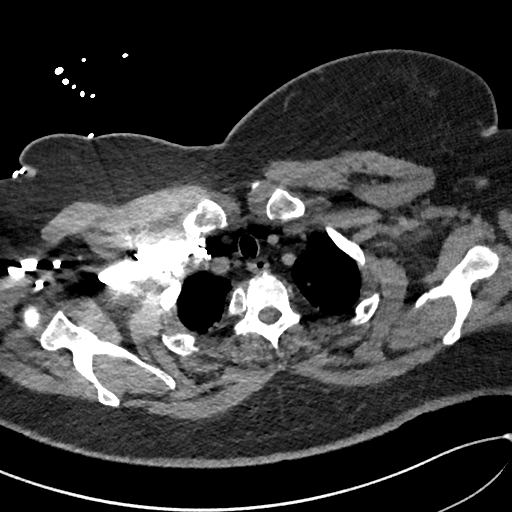
[im 260/276  lung]
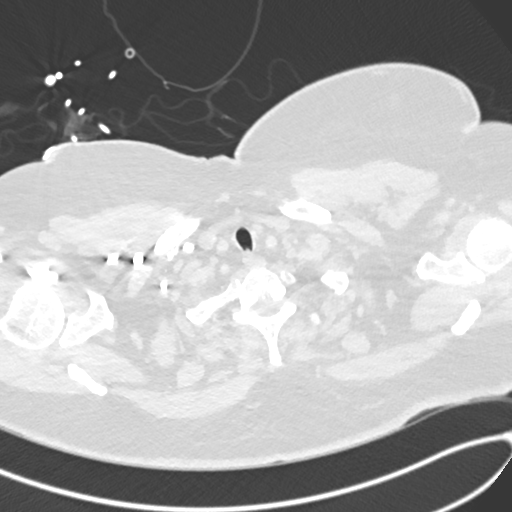

[Series 7: coronal mpr · coronal · 0.59mm/px · 3 of 136 slices shown]
[im 34/136  soft-tissue]
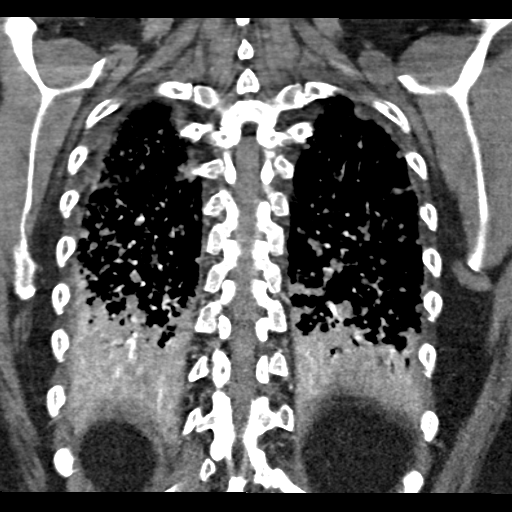
[im 68/136  soft-tissue]
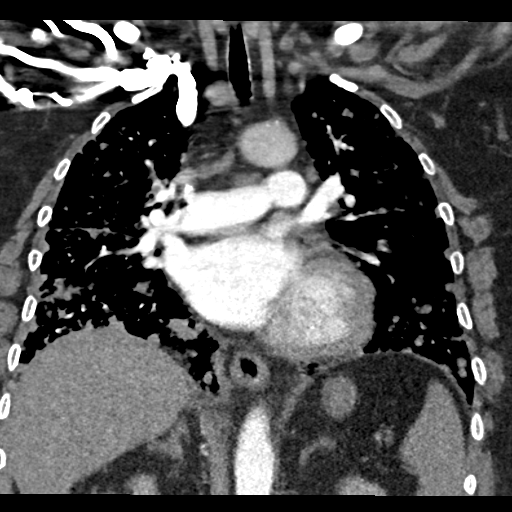
[im 102/136  soft-tissue]
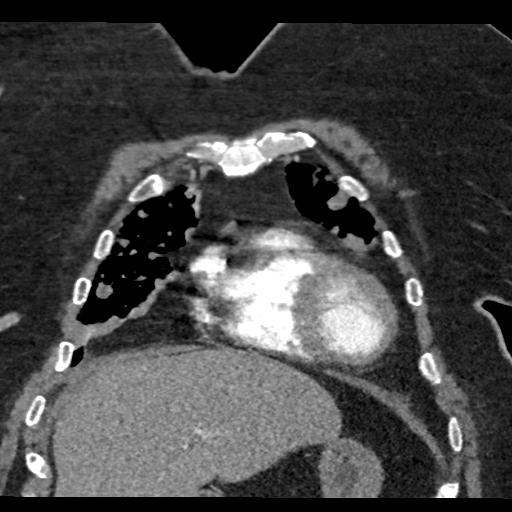

[18 of 46 positions shown; findings below may reference images not displayed]

FINDINGS: There are innumerable pulmonary nodules throughout both lungs,
representative dominant right middle lobe pulmonary nodule measuring
1.4 cm image 45. Trace pleural fluid or thickening noted. There is
extensive dependent compressive atelectasis.

The thyroid is normal in appearance where visualized. Great vessels
are normal in caliber. Mild atheromatous aortic calcification
without aneurysm. Allowing for mild bolus inhomogeneity due to
timing, there is no filling defect up to and involving the third
order pulmonary arteries to suggest acute pulmonary embolism.
Subsegmental emboli may be obscured by bolus inhomogeneity
particularly at the lung bases. Heart size is at upper limits of
normal. Bilateral hilar lymphadenopathy is noted, 1 cm on the left,
1.4 cm on the right. Sub carinal confluent lymphadenopathy measures
2.0 cm image 41. Pretracheal nodal conglomerate measures 1.2 cm
image 30. No axillary lymphadenopathy. Central airways are patent.

No pericardial effusion. Incomplete imaging of the upper abdomen
demonstrates incomplete visualization of the adrenal glands but no
mass identified. Multilevel disc degenerative change noted in the
spine. Schmorl's node formation is noted at multiple levels. No
lytic or sclerotic osseous lesion is identified.

Review of the MIP images confirms the above findings.
IMPRESSION: Innumerable diffuse pulmonary nodules with mediastinal and hilar
lymphadenopathy. Differential consideration includes hematogenous
spread of metastatic disease from possible abdominal or pelvic
primary neoplasm, miliary tuberculosis, other infection such as
fungal infection, and sarcoid. If further evaluation for possible
underlying malignancy is indicated, CT abdomen/pelvis with contrast
would be recommended for further evaluation.

Allowing for mild inhomogeneity of the contrast bolus predominantly
at the lung bases, there is no CT evidence for acute pulmonary
embolism up to and including the third order pulmonary arteries.

## 2014-12-12 IMAGING — CR DG CHEST 1V PORT
1 series · 1 of 1 positions shown · non-contrast
Comparison: DG CHEST 1V PORT dated 03/28/2013;

CLINICAL DATA: For Central line and endotracheal tube placement.
Diabetes and hypertension. Asthma. Biopsy of abnormally thickened
endometrium on 03/19/2013.

EXAM:
PORTABLE CHEST - 1 VIEW

[AP]
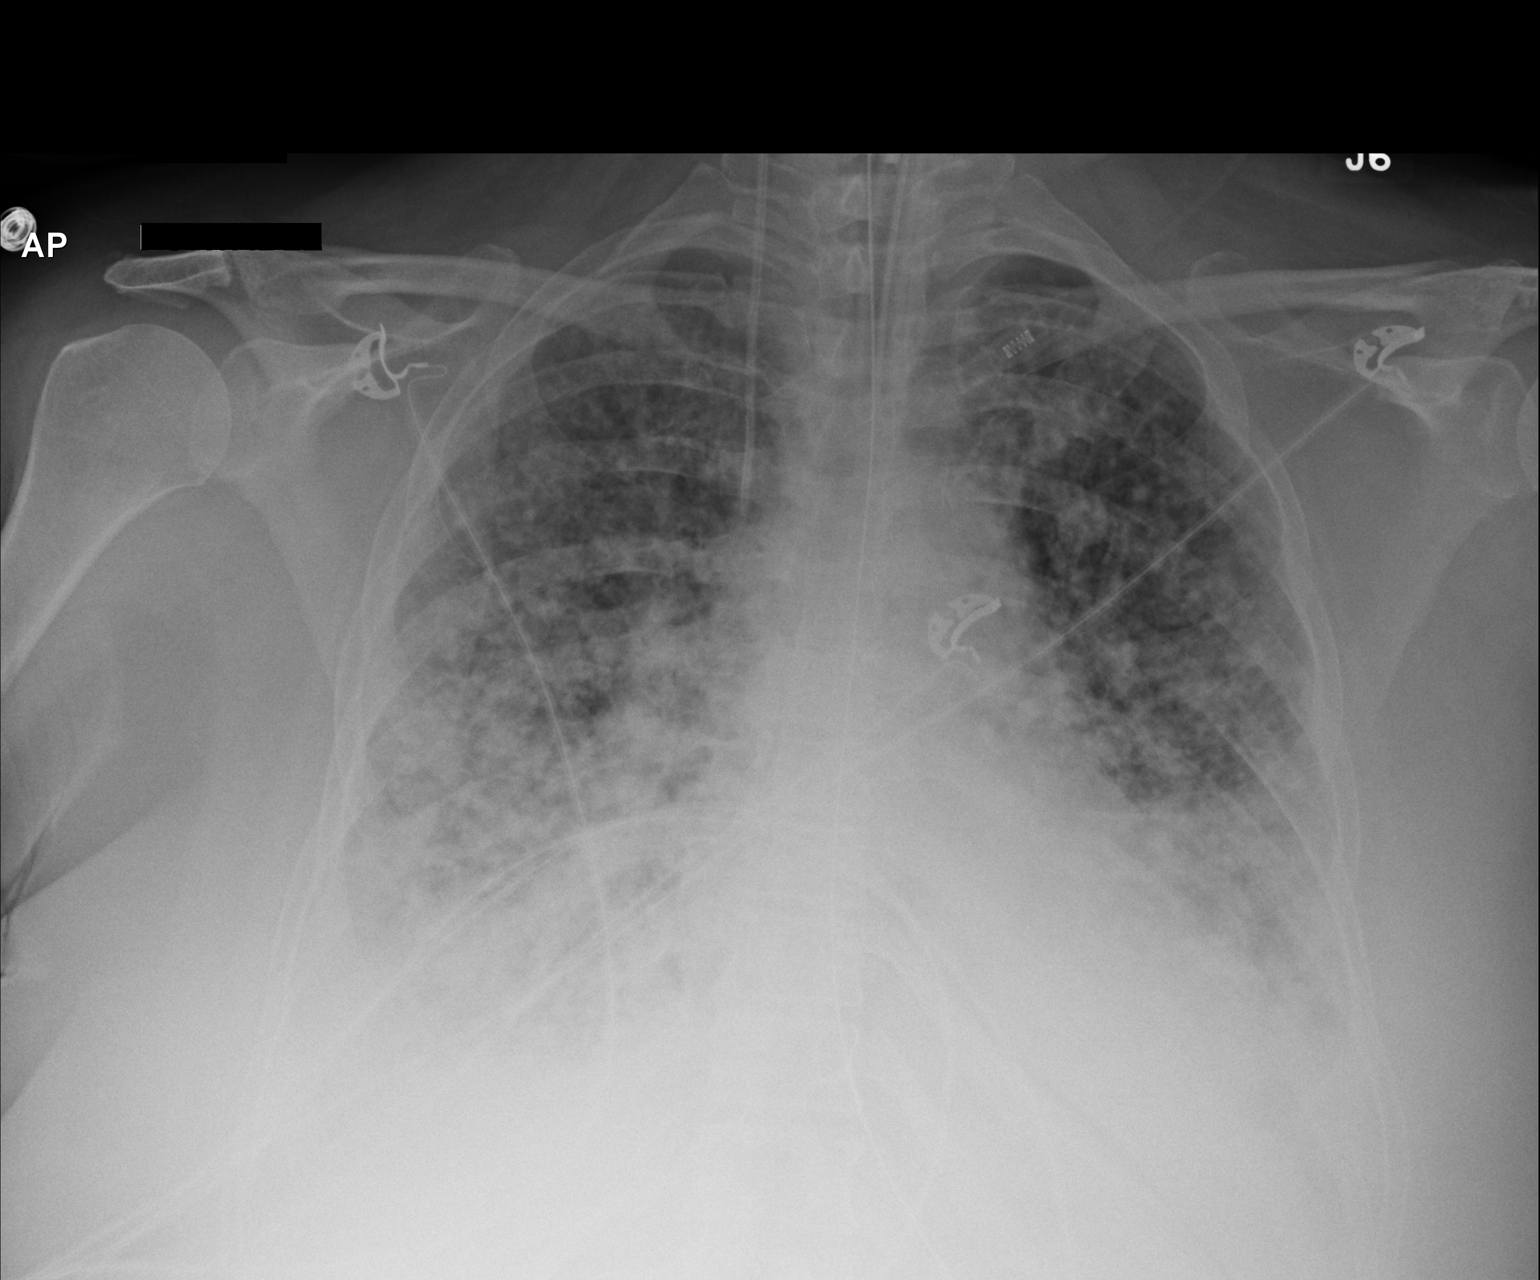

[1 of 1 positions shown; findings below may reference images not displayed]

CT ANGIO CHEST W/CM
&/OR WO/CM dated 03/27/2013; CT ABD/PELVIS W CM dated 03/28/2013
FINDINGS: Right IJ line tip: Upper SVC. Endotracheal tube tip 4 cm above the
carina. Nasogastric tube enters the stomach.

Continued miliary pulmonary nodules and airspace opacities and
confluent nodules in the lung bases.
IMPRESSION: 1. Tubes and lines appear satisfactorily positioned. No pneumothorax
or complicating feature.
2. Extensive miliary nodular lung disease with confluent airspace
opacities in the lung bases, similar to prior.
